# Patient Record
Sex: Female | Born: 1950 | Race: White | Hispanic: No | Marital: Married | Smoking: Never smoker
Health system: Southern US, Community
[De-identification: ages and names within clinical notes are randomized; demographics above are authoritative.]

## PROBLEM LIST (undated history)

## (undated) DIAGNOSIS — K219 Gastro-esophageal reflux disease without esophagitis: Secondary | ICD-10-CM

## (undated) DIAGNOSIS — F419 Anxiety disorder, unspecified: Secondary | ICD-10-CM

## (undated) DIAGNOSIS — E079 Disorder of thyroid, unspecified: Secondary | ICD-10-CM

## (undated) DIAGNOSIS — J3081 Allergic rhinitis due to animal (cat) (dog) hair and dander: Secondary | ICD-10-CM

## (undated) DIAGNOSIS — I10 Essential (primary) hypertension: Secondary | ICD-10-CM

## (undated) DIAGNOSIS — R7989 Other specified abnormal findings of blood chemistry: Secondary | ICD-10-CM

## (undated) DIAGNOSIS — M1990 Unspecified osteoarthritis, unspecified site: Secondary | ICD-10-CM

## (undated) HISTORY — PX: INDUCED ABORTION: SHX677

## (undated) HISTORY — DX: Anxiety disorder, unspecified: F41.9

## (undated) HISTORY — DX: Essential (primary) hypertension: I10

## (undated) HISTORY — PX: TONSILLECTOMY: SUR1361

## (undated) HISTORY — DX: Other specified abnormal findings of blood chemistry: R79.89

## (undated) HISTORY — PX: HAND SURGERY: SHX662

## (undated) HISTORY — DX: Gastro-esophageal reflux disease without esophagitis: K21.9

## (undated) HISTORY — DX: Allergic rhinitis due to animal (cat) (dog) hair and dander: J30.81

## (undated) HISTORY — PX: COLONOSCOPY: SHX174

---

## 2012-10-16 ENCOUNTER — Emergency Department (HOSPITAL_COMMUNITY): Payer: PRIVATE HEALTH INSURANCE

## 2012-10-16 ENCOUNTER — Encounter (HOSPITAL_COMMUNITY): Payer: Self-pay

## 2012-10-16 ENCOUNTER — Emergency Department (HOSPITAL_COMMUNITY)
Admission: EM | Admit: 2012-10-16 | Discharge: 2012-10-16 | Disposition: A | Discharge status: Home / Self Care | Payer: PRIVATE HEALTH INSURANCE | Attending: Emergency Medicine | Admitting: Emergency Medicine

## 2012-10-16 DIAGNOSIS — Z862 Personal history of diseases of the blood and blood-forming organs and certain disorders involving the immune mechanism: Secondary | ICD-10-CM | POA: Insufficient documentation

## 2012-10-16 DIAGNOSIS — Z8639 Personal history of other endocrine, nutritional and metabolic disease: Secondary | ICD-10-CM | POA: Insufficient documentation

## 2012-10-16 DIAGNOSIS — R209 Unspecified disturbances of skin sensation: Secondary | ICD-10-CM | POA: Insufficient documentation

## 2012-10-16 DIAGNOSIS — Y9389 Activity, other specified: Secondary | ICD-10-CM | POA: Insufficient documentation

## 2012-10-16 DIAGNOSIS — IMO0002 Reserved for concepts with insufficient information to code with codable children: Secondary | ICD-10-CM | POA: Insufficient documentation

## 2012-10-16 DIAGNOSIS — Y929 Unspecified place or not applicable: Secondary | ICD-10-CM | POA: Insufficient documentation

## 2012-10-16 DIAGNOSIS — S62619A Displaced fracture of proximal phalanx of unspecified finger, initial encounter for closed fracture: Secondary | ICD-10-CM

## 2012-10-16 DIAGNOSIS — Z8739 Personal history of other diseases of the musculoskeletal system and connective tissue: Secondary | ICD-10-CM | POA: Insufficient documentation

## 2012-10-16 DIAGNOSIS — W010XXA Fall on same level from slipping, tripping and stumbling without subsequent striking against object, initial encounter: Secondary | ICD-10-CM | POA: Insufficient documentation

## 2012-10-16 HISTORY — DX: Unspecified osteoarthritis, unspecified site: M19.90

## 2012-10-16 HISTORY — DX: Disorder of thyroid, unspecified: E07.9

## 2012-10-16 MED ORDER — HYDROCODONE-ACETAMINOPHEN 5-325 MG PO TABS
1.0000 | ORAL_TABLET | Freq: Once | ORAL | Status: AC
  Administered 2012-10-16: 1 via ORAL
  Filled 2012-10-16: qty 1

## 2012-10-16 MED ORDER — HYDROCODONE-ACETAMINOPHEN 5-325 MG PO TABS: ORAL_TABLET | ORAL | Status: DC

## 2012-10-16 NOTE — Progress Notes (Signed)
Orthopedic Tech Progress Note Patient Details:  Tauna Macfarlane August 08, 1951 [id number] Right ulna gutter splint applied. Tolerated well. Instructions given.  Ortho Devices Type of Ortho Device: Ulna gutter splint Ortho Device/Splint Location: Right Ortho Device/Splint Interventions: Application   Asia R Thompson 10/16/2012, 11:02 AM

## 2012-10-16 NOTE — Discharge Instructions (Signed)
Finger Fracture  Fractures of fingers are breaks in the bones of the fingers. There are many types of fractures. There are different ways of treating these fractures, all of which can be correct. Your caregiver will discuss the best way to treat your fracture.  TREATMENT   Finger fractures can be treated with:    Non-reduction - this means the bones are in place. The finger is splinted without changing the positions of the bone pieces. The splint is usually left on for about a week to ten days. This will depend on your fracture and what your caregiver thinks.   Closed reduction - the bones are put back into position without using surgery. The finger is then splinted.   ORIF (open reduction and internal fixation) - the fracture site is opened. Then the bone pieces are fixed into place with pins or some type of hardware. This is seldom required. It depends on the severity of the fracture.  Your caregiver will discuss the type of fracture you have and the treatment that will be best for that problem. If surgery is the treatment of choice, the following is information for you to know and also let your caregiver know about prior to surgery.  LET YOUR CAREGIVER KNOW ABOUT:   Allergies   Medications taken including herbs, eye drops, over the counter medications, and creams   Use of steroids (by mouth or creams)   Previous problems with anesthetics or Novocaine   Possibility of pregnancy, if this applies   History of blood clots (thrombophlebitis)   History of bleeding or blood problems   Previous surgery   Other health problems  AFTER THE PROCEDURE  After surgery, you will be taken to the recovery area where a nurse will check your progress. Once you're awake, stable, and taking fluids well, barring other problems you will be allowed to go home. Once home an ice pack applied to your operative site may help with discomfort and keep the swelling down.  HOME CARE INSTRUCTIONS     Follow your caregiver's instructions as to activities, exercises, physical therapy, and driving a car.   Use your finger and exercise as directed.   Only take over-the-counter or prescription medicines for pain, discomfort, or fever as directed by your caregiver. Do not take aspirin until your caregiver OK's it, as this can increase bleeding immediately following surgery.   Stop using ibuprofen if it upsets your stomach. Let your caregiver know about it.  SEEK MEDICAL CARE IF:   You have increased bleeding (more than a small spot) from the wound or from beneath your splint.   You develop redness, swelling, or increasing pain in the wound or from beneath your splint.   There is pus coming from the wound or from beneath your splint.   An unexplained oral temperature above 102 F (38.9 C) develops, or as your caregiver suggests.   There is a foul smell coming from the wound or dressing or from beneath your splint.  SEEK IMMEDIATE MEDICAL CARE IF:    You develop a rash.   You have difficulty breathing.   You have any allergic problems.  MAKE SURE YOU:    Understand these instructions.   Will watch your condition.   Will get help right away if you are not doing well or get worse.  Document Released: 12/13/2000 Document Revised: 11/23/2011 Document Reviewed: 04/19/2008  ExitCare Patient Information 2013 [employer].

## 2012-10-16 NOTE — ED Provider Notes (Addendum)
History     CSN: [id number]  Arrival date & time 10/16/12  0806   First MD Initiated Contact with Patient 10/16/12 [phone]      Chief Complaint  Patient presents with  . Hand Pain    (Consider location/radiation/quality/duration/timing/severity/associated sxs/prior treatment) HPI Comments: Patient is a 62 y/o female who presents to the ED with her husband complaining of right hand pain.  Patient states that she stumbled while getting out of the car and fell on her hand approximately 20 minutes ago.  She denies hitting her head and loss of consciousness.  Pain is described as 4/10, dull, achy pain that radiates to the elbow and to the fingertips.  Patient notes some associated numbness and tingling in her ring finger and pinky.  Movement aggravates pain.  No relieving factors have been identified.  Patient denies any prior injuries to her hand.  Patient is right handed and is an empty nester.        Patient is a 62 y.o. female presenting with hand pain. The history is provided by the patient and the spouse. No language interpreter was used.  Hand Pain    Past Medical History  Diagnosis Date  . Arthritis   . Thyroid disease     Past Surgical History  Procedure Date  . Tonsillectomy   . Induced abortion     No family history on file.  History  Substance Use Topics  . Smoking status: Never Smoker   . Smokeless tobacco: Not on file  . Alcohol Use: Yes     Comment: social    OB History    Grav Para Term Preterm Abortions TAB SAB Ect Mult Living                  Review of Systems  Musculoskeletal: Positive for arthralgias.  Neurological: Positive for numbness.  All other systems reviewed and are negative.    Allergies  Review of patient's allergies indicates no known allergies.  Home Medications   Current Outpatient Rx  Name  Route  Sig  Dispense  Refill  . HYDROCODONE-ACETAMINOPHEN 5-325 MG PO TABS      1-2 tablets po q 6 hours prn moderate to severe pain   20  tablet   0     BP 155/83  Pulse 73  Temp 97.7 F (36.5 C) (Oral)  Resp 20  SpO2 100%  Physical Exam  Constitutional: She is oriented to person, place, and time. She appears well-developed and well-nourished. No distress.  HENT:  Head: Normocephalic and atraumatic.  Eyes: Conjunctivae normal are normal.  Neck: Normal range of motion. Neck supple.  Cardiovascular: Normal rate, regular rhythm and normal heart sounds.  Exam reveals no gallop and no friction rub.   No murmur heard. Pulses:      Radial pulses are 2+ on the right side, and 2+ on the left side.  Pulmonary/Chest: Effort normal and breath sounds normal. No respiratory distress. She has no wheezes. She has no rales. She exhibits no tenderness.  Musculoskeletal:       Mild edema of the right hand, with no evidence of ecchymosis or erythema.  Tenderness to palpation of the 5th Metacarpal and proximal phalanx.  No snuff box tenderness noted.  Full active ROM of the right elbow and wrist.  Active ROM of the fingers was mildly limited due to pain.  5/5 opposition of the thumb.      Neurological: She is alert and oriented to person, place, and  time.       Sensation intact to touch in the median, ulnar, and radial nerve distribution in the right hand.    Skin: Skin is warm and dry. She is not diaphoretic.  Psychiatric: She has a normal mood and affect. Her behavior is normal.    ED Course  Procedures (including critical care time)   Provided initial definitive fracture care until seen by hand surgery with upper extremity ulnar gutter splint.     Labs Reviewed - No data to display Dg Hand Complete Right  10/16/2012  *RADIOLOGY REPORT*  Clinical Data: Pain post fall  RIGHT HAND - COMPLETE 3+ VIEW  Comparison: None.  Findings: Transverse fracture across the base of the proximal phalanx right little finger, with 1-2 mm distraction and mild dorsal angulation of the distal fracture fragment.  No fracture extension to the subchondral  cortex articular surface.  No other bony abnormalities are seen.  Normal mineralization.  No significant osseous degenerative change .  IMPRESSION:  Angulated transverse fracture, base proximal phalanx right little finger.   Original Report Authenticated By: D. Andria Rhein, MD      1. Proximal phalanx fracture of finger     I reviewed the plain films myself and reveals a mildly displaced, proximal transverse phalanx fracture of the fifth digit of her right hand.  MDM   Patient with finger fracture from mechanical fall of her right dominant upper extremity. No snuff box tenderness, full range of motion of wrist. Full range of motion of shoulder and elbow without discomfort. I discussed with Dr. Mina Marble who reviewed the x-ray recommends that she does need a surgical procedure in 7-10 days. Patient is given options to have surgery done either in home town in [place], [place] or back here. Plan is to place an ulnar splint, prescription for analgesics and return precautions.        Gavin Pound. Oletta Lamas, MD 10/16/12 4098  Gavin Pound. Oletta Lamas, MD 10/16/12 1191

## 2012-10-16 NOTE — ED Notes (Signed)
Ortho tech paged  

## 2012-10-16 NOTE — ED Notes (Signed)
Pt fell outside of [employer] and "broke" her fall with right hand.  Pt now c/o right hand pain.

## 2015-10-04 LAB — CBC AND DIFFERENTIAL
HCT: 43 (ref 36–46)
Hemoglobin: 14.3 (ref 12.0–16.0)
Platelets: 220 (ref 150–399)
WBC: 3.8

## 2015-10-04 LAB — BASIC METABOLIC PANEL
BUN: 13 (ref 4–21)
Creatinine: 0.9 (ref <=1.1)
GLUCOSE: 93
POTASSIUM: 5.1 (ref 3.4–5.3)
SODIUM: 136 — AB (ref 137–147)

## 2015-10-04 LAB — LIPID PANEL
Cholesterol: 239 — AB (ref 0–200)
HDL: 53 (ref 35–70)
LDL Cholesterol: 172
Triglycerides: 70 (ref 40–160)

## 2015-10-17 ENCOUNTER — Other Ambulatory Visit: Payer: Self-pay

## 2015-10-17 ENCOUNTER — Other Ambulatory Visit (HOSPITAL_COMMUNITY)
Admission: RE | Admit: 2015-10-17 | Discharge: 2015-10-17 | Disposition: A | Discharge status: Home / Self Care | Payer: BLUE CROSS/BLUE SHIELD | Source: Ambulatory Visit | Attending: Family Medicine | Admitting: Family Medicine

## 2015-10-17 DIAGNOSIS — Z01419 Encounter for gynecological examination (general) (routine) without abnormal findings: Secondary | ICD-10-CM | POA: Diagnosis present

## 2015-10-17 DIAGNOSIS — Z1231 Encounter for screening mammogram for malignant neoplasm of breast: Secondary | ICD-10-CM

## 2015-10-17 LAB — HM PAP SMEAR: HM Pap smear: NORMAL

## 2015-10-18 LAB — HM HEPATITIS C SCREENING LAB: HM Hepatitis Screen: NEGATIVE

## 2015-10-22 LAB — CYTOLOGY - PAP

## 2017-09-21 ENCOUNTER — Other Ambulatory Visit: Payer: Self-pay

## 2017-09-21 ENCOUNTER — Encounter: Payer: Self-pay | Admitting: Physician Assistant

## 2017-09-21 ENCOUNTER — Ambulatory Visit (INDEPENDENT_AMBULATORY_CARE_PROVIDER_SITE_OTHER): Payer: BLUE CROSS/BLUE SHIELD | Admitting: Physician Assistant

## 2017-09-21 DIAGNOSIS — K219 Gastro-esophageal reflux disease without esophagitis: Secondary | ICD-10-CM | POA: Diagnosis not present

## 2017-09-21 NOTE — Patient Instructions (Signed)
Start the Omeprazole daily as directed. Increase fluid intake. Continue natural supplements. I do not feel we need to start the Carafate presently. Limit diffuser use for now. Limit Turmeric.  Follow-up with me in 1 week for reassessment.   Food Choices for Gastroesophageal Reflux Disease, Adult When you have gastroesophageal reflux disease (GERD), the foods you eat and your eating habits are very important. Choosing the right foods can help ease your discomfort. What guidelines do I need to follow?  Choose fruits, vegetables, whole grains, and low-fat dairy products.  Choose low-fat meat, fish, and poultry.  Limit fats such as oils, salad dressings, butter, nuts, and avocado.  Keep a food diary. This helps you identify foods that cause symptoms.  Avoid foods that cause symptoms. These may be different for everyone.  Eat small meals often instead of 3 large meals a day.  Eat your meals slowly, in a place where you are relaxed.  Limit fried foods.  Cook foods using methods other than frying.  Avoid drinking alcohol.  Avoid drinking large amounts of liquids with your meals.  Avoid bending over or lying down until 2-3 hours after eating. What foods are not recommended? These are some foods and drinks that may make your symptoms worse: Vegetables Tomatoes. Tomato juice. Tomato and spaghetti sauce. Chili peppers. Onion and garlic. Horseradish. Fruits Oranges, grapefruit, and lemon (fruit and juice). Meats High-fat meats, fish, and poultry. This includes hot dogs, ribs, ham, sausage, salami, and bacon. Dairy Whole milk and chocolate milk. Sour cream. Cream. Butter. Ice cream. Cream cheese. Drinks Coffee and tea. Bubbly (carbonated) drinks or energy drinks. Condiments Hot sauce. Barbecue sauce. Sweets/Desserts Chocolate and cocoa. Donuts. Peppermint and spearmint. Fats and Oils High-fat foods. This includes [place] fries and potato chips. Other Vinegar. Strong spices.  This includes black pepper, white pepper, red pepper, cayenne, curry powder, cloves, ginger, and chili powder. The items listed above may not be a complete list of foods and drinks to avoid. Contact your dietitian for more information. This information is not intended to replace advice given to you by your health care provider. Make sure you discuss any questions you have with your health care provider. Document Released: 03/01/2012 Document Revised: 02/06/2016 Document Reviewed: 07/05/2013 Elsevier Interactive Patient Education  2017 [employer]

## 2017-09-21 NOTE — Progress Notes (Signed)
Patient presents to clinic today to establish care.  Acute Concerns: Patient with history of GERD, previously controlled with paleo diet and supplements given by naturopath. Over the past few weeks has noted increased GERD with globus sensation, intermittent LUQ pain and occasional nausea. Was seen at an Urgent Care 2 days ago and given an Rx for Carafate and Prilosec. Has not started medication. Is keeping a bland diet. Denies melena, hematochezia, tenesmus or change to bowel habits.    Past Medical History:  Diagnosis Date  . Arthritis   . GERD (gastroesophageal reflux disease)   . Thyroid disease     Past Surgical History:  Procedure Laterality Date  . HAND SURGERY Right   . INDUCED ABORTION    . TONSILLECTOMY      Current Outpatient Medications on File Prior to Visit  Medication Sig Dispense Refill  . calcium carbonate 1250 MG capsule Take 1,250 mg by mouth 2 (two) times daily with a meal.    . l-methylfolate-B6-B12 (METANX) 3-35-2 MG TABS tablet Take 1 tablet by mouth daily.    Marland Kitchen omega-3 fish oil (MAXEPA) 1000 MG CAPS capsule Take 1 capsule by mouth daily.    . vitamin E 1000 UNIT capsule Take 1,000 Units by mouth daily.    Marland Kitchen omeprazole (PRILOSEC) 20 MG capsule   0  . sucralfate (CARAFATE) 1 g tablet   0   No current facility-administered medications on file prior to visit.     No Known Allergies  Family History  Problem Relation Age of Onset  . Cancer Mother   . Cancer Father        Colon  . Diabetes Sister   . Heart disease Brother   . Emphysema Brother     Social History   Socioeconomic History  . Marital status: Married    Spouse name: Not on file  . Number of children: Not on file  . Years of education: Not on file  . Highest education level: Not on file  Social Needs  . Financial resource strain: Not on file  . Food insecurity - worry: Not on file  . Food insecurity - inability: Not on file  . Transportation needs - medical: Not on file  .  Transportation needs - non-medical: Not on file  Occupational History  . Not on file  Tobacco Use  . Smoking status: Never Smoker  . Smokeless tobacco: Never Used  Substance and Sexual Activity  . Alcohol use: No    Frequency: Never  . Drug use: No  . Sexual activity: Not on file  Other Topics Concern  . Not on file  Social History Narrative  . Not on file   Review of Systems  Eyes: Negative for blurred vision and double vision.  Respiratory: Positive for cough. Negative for sputum production.   Cardiovascular: Negative for chest pain and palpitations.  Gastrointestinal: Positive for abdominal pain, heartburn and nausea. Negative for blood in stool, constipation, diarrhea, melena and vomiting.  Genitourinary: Negative for dysuria, flank pain, frequency, hematuria and urgency.  Neurological: Negative for dizziness and loss of consciousness.   BP 128/82   Pulse 61   Temp 97.8 F (36.6 C) (Oral)   Resp 14   Ht 5\' 1"  (1.549 m)   Wt 170 lb (77.1 kg)   SpO2 98%   BMI 32.12 kg/m   Physical Exam  Constitutional: She is well-developed, well-nourished, and in no distress.  HENT:  Head: Normocephalic and atraumatic.  Eyes: Conjunctivae are normal.  Neck: Neck supple.  Cardiovascular: Normal rate, regular rhythm, normal heart sounds and intact distal pulses.  Pulmonary/Chest: Effort normal and breath sounds normal. No respiratory distress. She has no wheezes. She has no rales. She exhibits no tenderness.  Abdominal: Soft. Bowel sounds are normal. She exhibits no distension and no mass. There is tenderness in the left upper quadrant. There is no rebound and no guarding.  Vitals reviewed.  Assessment/Plan: 1. Gastroesophageal reflux disease without esophagitis Start Prilosec in addition to her natural digestive enzymes and supplements. GERD diet recommended. Increase fluid intake. Close follow-up scheduled. Will complete CPE at that time.    Piedad ClimesWilliam Cody Blaike Vickers, PA-C

## 2017-09-22 ENCOUNTER — Telehealth: Payer: Self-pay | Admitting: *Deleted

## 2017-09-22 NOTE — Telephone Encounter (Signed)
   Copied from CRM [id number]. Topic: General - Other >> Sep 22, 2017 10:40 AM Debroah LoopLander, Lumin L wrote: Reason for CRM: Patient saw Selena BattenCody yesterday as a new patient and has a question about Omeprazole that he prescribed. She wants to know if she can take prednisone w/ it? Please call back.

## 2017-09-22 NOTE — Telephone Encounter (Signed)
Spoke with patient and advised to hold off on starting the Prednisone for the bronchitis with the Omeprazole. She has follow up with PCP next week. Can discuss in further then

## 2017-09-28 ENCOUNTER — Ambulatory Visit (INDEPENDENT_AMBULATORY_CARE_PROVIDER_SITE_OTHER): Payer: BLUE CROSS/BLUE SHIELD | Admitting: Physician Assistant

## 2017-09-28 ENCOUNTER — Encounter: Payer: Self-pay | Admitting: Physician Assistant

## 2017-09-28 VITALS — BP 118/80 | HR 64 | Temp 97.8°F | Resp 14 | Ht 61.0 in | Wt 169.0 lb

## 2017-09-28 DIAGNOSIS — B9689 Other specified bacterial agents as the cause of diseases classified elsewhere: Secondary | ICD-10-CM | POA: Diagnosis not present

## 2017-09-28 DIAGNOSIS — J208 Acute bronchitis due to other specified organisms: Secondary | ICD-10-CM | POA: Diagnosis not present

## 2017-09-28 DIAGNOSIS — K219 Gastro-esophageal reflux disease without esophagitis: Secondary | ICD-10-CM | POA: Diagnosis not present

## 2017-09-28 MED ORDER — AZITHROMYCIN 250 MG PO TABS: ORAL_TABLET | ORAL | 0 refills | Status: DC

## 2017-09-28 MED ORDER — BENZONATATE 100 MG PO CAPS
100.0000 mg | ORAL_CAPSULE | Freq: Two times a day (BID) | ORAL | 0 refills | Status: DC | PRN
Start: 2017-09-28 — End: 2017-10-12

## 2017-09-28 NOTE — Progress Notes (Signed)
Patient presents to clinic today for follow-up of GERD. At last visit, she was started on Prilosec in addition to diet and digestive enzymes. Is taking as directed with improvement in symptoms. Still noting some occasional "spasming in her esophagus". Symptoms are still worse at night. Endorses following a strict diet. Denies new or worsening symptoms.  Patient also noted cough x 2 weeks that is now productive of sometimes green sputum. Denies chest pain, shortness of breath, ear pain or sinus pain. Denies fever, chills, aches. Symptoms are not improving and are gradually worsening.  Past Medical History:  Diagnosis Date  . Arthritis   . GERD (gastroesophageal reflux disease)   . Thyroid disease     Current Outpatient Medications on File Prior to Visit  Medication Sig Dispense Refill  . calcium carbonate 1250 MG capsule Take 1,250 mg by mouth 2 (two) times daily with a meal.    . l-methylfolate-B6-B12 (METANX) 3-35-2 MG TABS tablet Take 1 tablet by mouth daily.    Marland Kitchen. omega-3 fish oil (MAXEPA) 1000 MG CAPS capsule Take 1 capsule by mouth daily.    Marland Kitchen. omeprazole (PRILOSEC) 20 MG capsule Take 20 mg by mouth daily.   0  . vitamin E 1000 UNIT capsule Take 1,000 Units by mouth daily.    . sucralfate (CARAFATE) 1 g tablet   0   No current facility-administered medications on file prior to visit.     No Known Allergies  Family History  Problem Relation Age of Onset  . Cancer Mother   . Cancer Father        Colon  . Diabetes Sister   . Heart disease Brother   . Emphysema Brother     Social History   Socioeconomic History  . Marital status: Married    Spouse name: None  . Number of children: None  . Years of education: None  . Highest education level: None  Social Needs  . Financial resource strain: None  . Food insecurity - worry: None  . Food insecurity - inability: None  . Transportation needs - medical: None  . Transportation needs - non-medical: None  Occupational  History  . None  Tobacco Use  . Smoking status: Never Smoker  . Smokeless tobacco: Never Used  Substance and Sexual Activity  . Alcohol use: No    Frequency: Never  . Drug use: No  . Sexual activity: None  Other Topics Concern  . None  Social History Narrative  . None   Review of Systems - See HPI.  All other ROS are negative.  BP 118/80   Pulse 64   Temp 97.8 F (36.6 C) (Oral)   Resp 14   Ht 5\' 1"  (1.549 m)   Wt 169 lb (76.7 kg)   SpO2 98%   BMI 31.93 kg/m   Physical Exam  Constitutional: She is oriented to person, place, and time and well-developed, well-nourished, and in no distress.  HENT:  Head: Normocephalic and atraumatic.  Right Ear: Tympanic membrane normal.  Left Ear: Tympanic membrane normal.  Eyes: Conjunctivae are normal.  Neck: Neck supple.  Cardiovascular: Normal rate, regular rhythm, normal heart sounds and intact distal pulses.  Pulmonary/Chest: Effort normal. No respiratory distress. She has no wheezes. She has rales (clear with coughing) in the left lower field. She exhibits no tenderness.  Neurological: She is alert and oriented to person, place, and time.  Skin: Skin is warm and dry. No rash noted.  Vitals reviewed.  Assessment/Plan: 1. Acute  bacterial bronchitis Rx Azithromycin.  Increase fluids.  Rest.  Saline nasal spray.  Probiotic.  Mucinex as directed.  Humidifier in bedroom. Tessalon per orders.  Call or return to clinic if symptoms are not improving.  - azithromycin (ZITHROMAX) 250 MG tablet; Take 2 tablets on Day 1. Then take 1 tablet daily.  Dispense: 6 tablet; Refill: 0 - benzonatate (TESSALON) 100 MG capsule; Take 1 capsule (100 mg total) by mouth 2 (two) times daily as needed for cough.  Dispense: 20 capsule; Refill: 0  2. Gastroesophageal reflux disease without esophagitis Improving with PPI and dietary changes. URI exacerbating symptoms. Will increase PPI to BID x 3 days to help. Then decreasing back to QD dosing. Supportive  measures reviewed. Follow-up scheduled.    Piedad Climes, PA-C

## 2017-09-28 NOTE — Patient Instructions (Signed)
Over the next couple of days, increase the Omeprazole to twice daily. Then resume once daily dosing. Continue digestive enzymes and diet.  Take antibiotic (Azithromycin) as directed.  Increase fluids.  Get plenty of rest. Use Mucinex for congestion. Tessalon as directed for cough. Take a daily probiotic (I recommend Align or Culturelle, but even Activia Yogurt may be beneficial).  A humidifier placed in the bedroom may offer some relief for a dry, scratchy throat of nasal irritation.  Read information below on acute bronchitis. Please call or return to clinic if symptoms are not improving.  Follow-up in 1 week for reassessment.   Acute Bronchitis Bronchitis is when the airways that extend from the windpipe into the lungs get red, puffy, and painful (inflamed). Bronchitis often causes thick spit (mucus) to develop. This leads to a cough. A cough is the most common symptom of bronchitis. In acute bronchitis, the condition usually begins suddenly and goes away over time (usually in 2 weeks). Smoking, allergies, and asthma can make bronchitis worse. Repeated episodes of bronchitis may cause more lung problems.  HOME CARE  Rest.  Drink enough fluids to keep your pee (urine) clear or pale yellow (unless you need to limit fluids as told by your doctor).  Only take over-the-counter or prescription medicines as told by your doctor.  Avoid smoking and secondhand smoke. These can make bronchitis worse. If you are a smoker, think about using nicotine gum or skin patches. Quitting smoking will help your lungs heal faster.  Reduce the chance of getting bronchitis again by:  Washing your hands often.  Avoiding people with cold symptoms.  Trying not to touch your hands to your mouth, nose, or eyes.  Follow up with your doctor as told.  GET HELP IF: Your symptoms do not improve after 1 week of treatment. Symptoms include:  Cough.  Fever.  Coughing up thick spit.  Body aches.  Chest  congestion.  Chills.  Shortness of breath.  Sore throat.  GET HELP RIGHT AWAY IF:   You have an increased fever.  You have chills.  You have severe shortness of breath.  You have bloody thick spit (sputum).  You throw up (vomit) often.  You lose too much body fluid (dehydration).  You have a severe headache.  You faint.  MAKE SURE YOU:   Understand these instructions.  Will watch your condition.  Will get help right away if you are not doing well or get worse. Document Released: 02/17/2008 Document Revised: 05/03/2013 Document Reviewed: 02/21/2013 [facility] Patient Information 2015 [employer], [place]. This information is not intended to replace advice given to you by your health care provider. Make sure you discuss any questions you have with your health care provider.

## 2017-10-01 ENCOUNTER — Encounter: Payer: Self-pay | Admitting: Emergency Medicine

## 2017-10-12 ENCOUNTER — Other Ambulatory Visit: Payer: Self-pay

## 2017-10-12 ENCOUNTER — Encounter: Payer: Self-pay | Admitting: Physician Assistant

## 2017-10-12 ENCOUNTER — Ambulatory Visit (INDEPENDENT_AMBULATORY_CARE_PROVIDER_SITE_OTHER): Payer: BLUE CROSS/BLUE SHIELD | Admitting: Physician Assistant

## 2017-10-12 VITALS — BP 120/70 | HR 61 | Temp 97.5°F | Resp 14 | Ht 61.0 in | Wt 167.0 lb

## 2017-10-12 DIAGNOSIS — K219 Gastro-esophageal reflux disease without esophagitis: Secondary | ICD-10-CM

## 2017-10-12 DIAGNOSIS — J3081 Allergic rhinitis due to animal (cat) (dog) hair and dander: Secondary | ICD-10-CM | POA: Insufficient documentation

## 2017-10-12 HISTORY — DX: Allergic rhinitis due to animal (cat) (dog) hair and dander: J30.81

## 2017-10-12 MED ORDER — PANTOPRAZOLE SODIUM 40 MG PO TBEC: 40.0000 mg | DELAYED_RELEASE_TABLET | Freq: Every day | ORAL | 3 refills | Status: DC

## 2017-10-12 NOTE — Assessment & Plan Note (Signed)
Keep animal out of bedroom. Wash linens. Air purifier in bedroom recommended. Start saline nasal rinses. May need to start OTC antihistamine.

## 2017-10-12 NOTE — Patient Instructions (Signed)
Please keep well-hydrated and get plenty of rest. Start the Pantoprazole once daily instead of the Omeprazole.  Keep a GERD diet (read info below).  Start saline nasal rinses for nasal drainage.  Consider starting OTC Xyzal. Recommend keeping the dog off the bed and out of bedroom.  You will be contacted by Gastroenterology for further assessment.  If you notice any new or worsening symptoms, please call or come see me.    Food Choices for Gastroesophageal Reflux Disease, Adult When you have gastroesophageal reflux disease (GERD), the foods you eat and your eating habits are very important. Choosing the right foods can help ease your discomfort. What guidelines do I need to follow?  Choose fruits, vegetables, whole grains, and low-fat dairy products.  Choose low-fat meat, fish, and poultry.  Limit fats such as oils, salad dressings, butter, nuts, and avocado.  Keep a food diary. This helps you identify foods that cause symptoms.  Avoid foods that cause symptoms. These may be different for everyone.  Eat small meals often instead of 3 large meals a day.  Eat your meals slowly, in a place where you are relaxed.  Limit fried foods.  Cook foods using methods other than frying.  Avoid drinking alcohol.  Avoid drinking large amounts of liquids with your meals.  Avoid bending over or lying down until 2-3 hours after eating. What foods are not recommended? These are some foods and drinks that may make your symptoms worse: Vegetables Tomatoes. Tomato juice. Tomato and spaghetti sauce. Chili peppers. Onion and garlic. Horseradish. Fruits Oranges, grapefruit, and lemon (fruit and juice). Meats High-fat meats, fish, and poultry. This includes hot dogs, ribs, ham, sausage, salami, and bacon. Dairy Whole milk and chocolate milk. Sour cream. Cream. Butter. Ice cream. Cream cheese. Drinks Coffee and tea. Bubbly (carbonated) drinks or energy drinks. Condiments Hot sauce. Barbecue  sauce. Sweets/Desserts Chocolate and cocoa. Donuts. Peppermint and spearmint. Fats and Oils High-fat foods. This includes JamaicaFrench fries and potato chips. Other Vinegar. Strong spices. This includes black pepper, white pepper, red pepper, cayenne, curry powder, cloves, ginger, and chili powder. The items listed above may not be a complete list of foods and drinks to avoid. Contact your dietitian for more information. This information is not intended to replace advice given to you by your health care provider. Make sure you discuss any questions you have with your health care provider. Document Released: 03/01/2012 Document Revised: 02/06/2016 Document Reviewed: 07/05/2013 Elsevier Interactive Patient Education  2017 [employer]

## 2017-10-12 NOTE — Progress Notes (Signed)
Patient presents to clinic today for follow-up of GERD and acute bronchitis. At last visit, patient was started on Azithromycin in addition to the Prilosec already started for GERD. She had been doing better with reflux but the infection and drainage was causing an acute exacerbation in GERD symptoms. Patient endorses doing much better. Endorses occasional dry cough, worse at night. Notes some nasal drainage and runny nose, worse at night. Does have a dog that sleeps in the bed and she things she is allergic.    Past Medical History:  Diagnosis Date  . Arthritis   . GERD (gastroesophageal reflux disease)   . Thyroid disease     Current Outpatient Medications on File Prior to Visit  Medication Sig Dispense Refill  . calcium carbonate 1250 MG capsule Take 1,250 mg by mouth 2 (two) times daily with a meal.    . l-methylfolate-B6-B12 (METANX) 3-35-2 MG TABS tablet Take 1 tablet by mouth daily.    Marland Kitchen. omega-3 fish oil (MAXEPA) 1000 MG CAPS capsule Take 1 capsule by mouth daily.    . vitamin E 1000 UNIT capsule Take 1,000 Units by mouth daily.     No current facility-administered medications on file prior to visit.     No Known Allergies  Family History  Problem Relation Age of Onset  . Cancer Mother   . Cancer Father        Colon  . Diabetes Sister   . Heart disease Brother   . Emphysema Brother     Social History   Socioeconomic History  . Marital status: Married    Spouse name: None  . Number of children: None  . Years of education: None  . Highest education level: None  Social Needs  . Financial resource strain: None  . Food insecurity - worry: None  . Food insecurity - inability: None  . Transportation needs - medical: None  . Transportation needs - non-medical: None  Occupational History  . None  Tobacco Use  . Smoking status: Never Smoker  . Smokeless tobacco: Never Used  Substance and Sexual Activity  . Alcohol use: No    Frequency: Never  . Drug use: No  .  Sexual activity: None  Other Topics Concern  . None  Social History Narrative  . None   Review of Systems - See HPI.  All other ROS are negative.  BP 120/70   Pulse 61   Temp (!) 97.5 F (36.4 C) (Oral)   Resp 14   Ht 5\' 1"  (1.549 m)   Wt 167 lb (75.8 kg)   SpO2 98%   BMI 31.55 kg/m   Physical Exam  Constitutional: She is oriented to person, place, and time and well-developed, well-nourished, and in no distress.  HENT:  Head: Normocephalic and atraumatic.  Eyes: Conjunctivae are normal. Pupils are equal, round, and reactive to light.  Neck: Neck supple.  Cardiovascular: Normal rate, regular rhythm, normal heart sounds and intact distal pulses.  Pulmonary/Chest: Effort normal and breath sounds normal. No respiratory distress. She has no wheezes. She has no rales. She exhibits no tenderness.  Lymphadenopathy:    She has no cervical adenopathy.  Neurological: She is alert and oriented to person, place, and time.  Skin: Skin is warm and dry. No rash noted.  Psychiatric: Affect normal.  Vitals reviewed.  Assessment/Plan: Allergic rhinitis due to animal hair and dander Keep animal out of bedroom. Wash linens. Air purifier in bedroom recommended. Start saline nasal rinses. May need to  start OTC antihistamine.   GERD (gastroesophageal reflux disease) Stop Omeprazole. Start Protonix. Continue supportive measures. Referral to GI placed for significant GERD. Continue dietary recommendations.    Piedad Climes, PA-C

## 2017-10-12 NOTE — Assessment & Plan Note (Signed)
Stop Omeprazole. Start Protonix. Continue supportive measures. Referral to GI placed for significant GERD. Continue dietary recommendations.

## 2017-10-13 ENCOUNTER — Encounter: Payer: Self-pay | Admitting: Physician Assistant

## 2017-11-01 ENCOUNTER — Encounter: Payer: Self-pay | Admitting: Physician Assistant

## 2017-11-01 ENCOUNTER — Ambulatory Visit (INDEPENDENT_AMBULATORY_CARE_PROVIDER_SITE_OTHER): Payer: BLUE CROSS/BLUE SHIELD | Admitting: Physician Assistant

## 2017-11-01 VITALS — BP 116/68 | HR 64 | Ht 61.0 in | Wt 163.2 lb

## 2017-11-01 DIAGNOSIS — Z800 Family history of malignant neoplasm of digestive organs: Secondary | ICD-10-CM | POA: Diagnosis not present

## 2017-11-01 DIAGNOSIS — R079 Chest pain, unspecified: Secondary | ICD-10-CM

## 2017-11-01 DIAGNOSIS — R1013 Epigastric pain: Secondary | ICD-10-CM

## 2017-11-01 MED ORDER — RANITIDINE HCL 150 MG PO TABS: ORAL_TABLET | ORAL | 2 refills | Status: DC

## 2017-11-01 NOTE — Patient Instructions (Signed)
We sent a prescription to [facility] [address]  1. Zantac 150 mg  Gradually advance your diet. Call us in 4-6 weeks with an update. Ask for Amy's nurse, we will decide then if you need an Endoscopy and colonoscopy or just a colonoscopy.   If you are age 67 or older, your body mass index should be between 23-30. Your Body mass index is 30.85 kg/m. If this is out of the aforementioned range listed, please consider follow up with your Primary Care Provider.

## 2017-11-01 NOTE — Progress Notes (Addendum)
Subjective:    Patient ID: Haley Ritter, female    DOB: 19-Mar-1951, 67 y.o.   MRN: [id number]  HPI Haley Ritter is a pleasant 67 year old white female, new to GI today referred by Piedad Climes PA-C for evaluation of epigastric pain and subxiphoid pain. Patient relates having prior screening colonoscopy done 10 or more years ago in [place]. She believes this was negative. She has not had prior EGD Patient had been being seen at [facility] since summer of 2018 and had related symptoms that she thought were consistent with reflux. She was placed on their reflux protocol which included digestive enzymes, Gastro ease with zinc, licorice and an HCl supplement. She felt like this was helping her and symptoms had improved until around Christmas time when she got off of her usual diet and schedule of medications etc. She then became ill with what she describes as a very bad episode of bronchitis which was viral. She says she was sick for several weeks with this. This prior to getting sick with the bronchitis she started have some epigastric discomfort and then since the bronchitis has had a lot of pain in the epigastrium and upper abdomen. She describes it as a soreness her goal 8. She has also had burning in her lower chest. At times with the bronchitis she had discomfort in her chest with deep breathing but that has resolved. She also mentions that she had been diagnosed with costochondritis a couple of years ago. She also has another pain in her left upper quadrant which she says has come and gone over the past 10 years. Bowel movements have been normal for her. She has been trying to do exercises to help "bring down" a hiatal hernia. She has  also changed her diet drastically from a Paleo  diet to mostly vegan diet and has been juicing and drinking shakes primarily rather than eating regular food. She feels this is helping her stomach a little bit. Weight is down about 8 pounds. No excessive  belching or burping no dysphagia or odynophagia. She did take a short course of omeprazole for about a week but says it didn't help and stopped it. Review of her chart shows that she did H. pylori breath testing in 2017 that was negative. Family history is positive for colon cancer in her father diagnosed in his early 34s.  Review of Systems Pertinent positive and negative review of systems were noted in the above HPI section.  All other review of systems was otherwise negative.  Outpatient Encounter Medications as of 11/01/2017  Medication Sig  . b complex vitamins capsule Take 1 capsule by mouth 2 (two) times daily.  Marland Kitchen omega-3 fish oil (MAXEPA) 1000 MG CAPS capsule Take 1 capsule by mouth daily.  Marland Kitchen OVER THE COUNTER MEDICATION Digestive Ezymes - 9 daily  . OVER THE COUNTER MEDICATION HCI with pepcid   6 daily  . OVER THE COUNTER MEDICATION Gastro Ease with zinc  3 daily  . OVER THE COUNTER MEDICATION Licorice 3 with meals  . Vitamin D, Ergocalciferol, (DRISDOL) 50000 units CAPS capsule Take 50,000 Units by mouth daily.  . vitamin E 1000 UNIT capsule Take 1,000 Units by mouth daily.  . ranitidine (ZANTAC) 150 MG tablet Take 1 tab by mouth twice daily.  . [DISCONTINUED] calcium carbonate 1250 MG capsule Take 1,250 mg by mouth 2 (two) times daily with a meal.  . [DISCONTINUED] l-methylfolate-B6-B12 (METANX) 3-35-2 MG TABS tablet Take 1 tablet by mouth  daily.  . [DISCONTINUED] pantoprazole (PROTONIX) 40 MG tablet Take 1 tablet (40 mg total) by mouth daily.   No facility-administered encounter medications on file as of 11/01/2017.    No Known Allergies Patient Active Problem List   Diagnosis Date Noted  . Allergic rhinitis due to animal hair and dander 10/12/2017  . GERD (gastroesophageal reflux disease) 09/21/2017   Social History   Socioeconomic History  . Marital status: Married    Spouse name: Not on file  . Number of children: 4  . Years of education: Not on file  . Highest  education level: Not on file  Social Needs  . Financial resource strain: Not on file  . Food insecurity - worry: Not on file  . Food insecurity - inability: Not on file  . Transportation needs - medical: Not on file  . Transportation needs - non-medical: Not on file  Occupational History  . Occupation: retired  Tobacco Use  . Smoking status: Former Games developer  . Smokeless tobacco: Never Used  . Tobacco comment: early 20's  Substance and Sexual Activity  . Alcohol use: No    Frequency: Never  . Drug use: No  . Sexual activity: Not on file  Other Topics Concern  . Not on file  Social History Narrative  . Not on file    Haley Ritter's family history includes Colon cancer in her father; Diabetes in her sister; Emphysema in her brother; Heart disease in her brother; Lung cancer in her mother.      Objective:    Vitals:   11/01/17 1418  BP: 116/68  Pulse: 64    Physical Exam; well-developed older white female in no acute distress, pleasant accompanied by her husband blood pressure 116/68 pulse 64, height 5 foot 1, weight 163, BMI of 30.8. HEENT; nontraumatic normocephalic EOMI PERRLA sclera anicteric, Cardiovascular; regular rate and rhythm with S1-S2 no murmur or gallop, Pulmonary; clear bilaterally Abdomen; soft, mild tenderness in the epigastrium there is no guarding or rebound no palpable mass or hepatosplenomegaly, no costal margin tenderness, Rectal; exam not done, Extremities; no clubbing cyanosis or edema skin warm and dry, Neuropsych; mood and affect appropriate       Assessment & Plan:   #[age] year old white female with probable underlying GERD, now with epigastric and subxiphoid discomfort over the past month exacerbated by a significant viral upper respiratory infection with bronchitis. Is suspect symptoms are secondary to GERD, possible mild esophagitis and/or gastritis. Patient had previously been tested for H. pylori and was negative. #2 colon cancer surveillance-last  colonoscopy 10+ years ago in [place], overdue for follow-up #3 positive family history of colon cancer in patient's father  Plan; patient prefers to avoid PPIs if possible, we will start ranitidine on 150 mg by mouth twice daily and plan to treat for 2-3 months. Antireflux diet-patient is encouraged to continue her current primarily vegetarian diet but gradually advanced most solid foods. We discussed EGD, which she is agreeable to if symptoms persist, but would like to hold off for now. We also discussed indication for colonoscopy which she is agreeable to. She will be established with Dr. Rhea Belton.. We agreed to treat her for her upper GI symptoms for about a month, and she is to call back with a progress report. If symptoms are persisting she will need EGD and colonoscopy scheduled with Dr. Rhea Belton, her GI symptoms resolve will need to be scheduled for colonoscopy with Dr. Rhea Belton when she calls back. She was also encouraged to  call in the interim at any point if symptoms worsen.  Haley Ritter S Tristine Langi PA-C 11/01/2017   Cc: Waldon MerlMartin, William C, PA-C  Addendum: Reviewed and agree with initial management including plans to repeat screening colonoscopy which is overdue in light of her father's history of colon cancer. ' Pyrtle, Carie CaddyJay M, MD

## 2018-01-07 ENCOUNTER — Telehealth: Payer: Self-pay | Admitting: Physician Assistant

## 2018-01-07 NOTE — Telephone Encounter (Signed)
Patient has made diet modification. She took Zantac for "one bottle." Her GERD symptoms have returned. She would like to go forward with EGD and get the colonoscopy done too. Are we okay to schedule her?

## 2018-01-07 NOTE — Telephone Encounter (Signed)
Pt wants to know if she should have an endocolon. Pls call her.

## 2018-01-10 NOTE — Telephone Encounter (Signed)
Spoke with the patient and she agrees to this plan. Preop 01/24/18. Procedure 02/02/18

## 2018-01-10 NOTE — Telephone Encounter (Signed)
I would advise she go back on Zantac 150 mg po BID - can send as RX , and the get her scheduled for EGD and COLON . Thanks

## 2018-01-24 ENCOUNTER — Ambulatory Visit (AMBULATORY_SURGERY_CENTER): Payer: Self-pay | Admitting: *Deleted

## 2018-01-24 ENCOUNTER — Other Ambulatory Visit: Payer: Self-pay

## 2018-01-24 VITALS — Ht 63.0 in | Wt 166.0 lb

## 2018-01-24 DIAGNOSIS — Z800 Family history of malignant neoplasm of digestive organs: Secondary | ICD-10-CM

## 2018-01-24 DIAGNOSIS — R1013 Epigastric pain: Secondary | ICD-10-CM

## 2018-01-24 MED ORDER — NA SULFATE-K SULFATE-MG SULF 17.5-3.13-1.6 GM/177ML PO SOLN: ORAL | 0 refills | Status: DC

## 2018-01-24 NOTE — Progress Notes (Signed)
Husband present in [place]. Patient denies any allergies to eggs or soy. Patient denies any problems with anesthesia/sedation. Patient denies any oxygen use at home. Patient denies taking any diet/weight loss medications or blood thinners. EMMI education assisgned to patient on colonoscopy and EGD, this was explained and instructions given to patient. Suprep coupon given to pt.

## 2018-02-02 ENCOUNTER — Encounter: Payer: BLUE CROSS/BLUE SHIELD | Admitting: Internal Medicine

## 2018-02-08 ENCOUNTER — Telehealth: Payer: Self-pay | Admitting: Internal Medicine

## 2018-02-08 NOTE — Telephone Encounter (Signed)
Pt is scheduled for an endocolon tomorrow at 2:30 pm with Dr. Rhea Belton. She forgot to follow diet 5 days before procedure so ate normally. She attended a graduation party, baby shower and memorial day celebrations. She wants to know if she can still have procedure tomorrow. Pls call her.

## 2018-02-08 NOTE — Telephone Encounter (Signed)
Discussed with pt that she should be fine as long as she follows her prep instructions as instructed. Pt verbalized understanding.

## 2018-02-09 ENCOUNTER — Encounter: Payer: BLUE CROSS/BLUE SHIELD | Admitting: Internal Medicine

## 2018-02-09 ENCOUNTER — Telehealth: Payer: Self-pay | Admitting: Internal Medicine

## 2018-02-09 ENCOUNTER — Other Ambulatory Visit: Payer: Self-pay

## 2018-02-09 DIAGNOSIS — R0789 Other chest pain: Secondary | ICD-10-CM

## 2018-02-09 NOTE — Telephone Encounter (Signed)
I spoke to Haley Ritter by phone She developed a severe headache last night after taking her first half of colonoscopy preparation. Headache has persisted. She is also having chest pressure located between her breasts to the left radiating to her back.  This has been intermittent. No real improvement with Zantac though she has not been using Zantac or other acid suppression recently. Initially she was seen by Mike Gip in February with similar symptom and Endo/colon was arranged and then rescheduled until today. She has had this previously evaluated by cardiology but greater than 3 years ago when she was living in [place] Last colonoscopy greater than 10 years ago; she does have a family history of colon cancer in her father  I do not think she can proceed with procedure today due to severe headache.  I also recommend that she be evaluated by cardiology for her chest pressure before we proceed with any endoscopic evaluation.  She is in agreement.  Please refer her for cardiology evaluation of chest pressure  Once she has completed cardiology evaluation I recommend that we proceed with consideration of endoscopy and colonoscopy.  I asked that she notify us once her cardiology evaluation is complete and she voiced understanding.  She is aware of my recommendation for endoscopic evaluation if no cardiology evaluation found but also for high risk screening colonoscopy.  Procedure canceled for today, no charge

## 2018-02-09 NOTE — Telephone Encounter (Signed)
Pt has colon scheduled for this afternoon. She needs to start prep at 8:30am but it is not feeling well, she has headache and feels a lot of pressure in her chest and her BP is higher than normal. She wants to make sure that it is ok to continue with second part of prep.

## 2018-02-09 NOTE — Telephone Encounter (Signed)
Referral entered in epic for cardiology-chest pain, cardiac clearance for endoscopy.

## 2018-02-09 NOTE — Telephone Encounter (Signed)
Called patient back, and she stated that she had "an excrutciating headache last night which she rated at an 8 out of 10. Stated that she had "chest pressure" without chest pain.  She stated that it was the reason for her Endoscopy for today.  Stated that she was very cold last night and she had to put on socks and wrap in a blanket.   I explained that the coldness was probably from the prep.  Her bp is 132/82 at present.  She stated that her headache was about "a four" at this time.  She has been drinking water, and she denies being dehydrated.  She states no chest pain at this time.  No nausea or vomiting.  states at "times the pain radiates to her back."  Due to take her prep at 0830. Spoke with Josh Monday, CRNA regarding her symptoms.   He will speak to Dr. Rhea Belton before he starts his cases for today.

## 2018-02-14 ENCOUNTER — Other Ambulatory Visit: Payer: Self-pay

## 2018-02-14 ENCOUNTER — Ambulatory Visit (INDEPENDENT_AMBULATORY_CARE_PROVIDER_SITE_OTHER): Payer: BLUE CROSS/BLUE SHIELD | Admitting: Physician Assistant

## 2018-02-14 ENCOUNTER — Encounter: Payer: Self-pay | Admitting: Physician Assistant

## 2018-02-14 VITALS — BP 110/70 | HR 66 | Temp 98.2°F | Resp 16 | Ht 63.0 in | Wt 166.0 lb

## 2018-02-14 DIAGNOSIS — K219 Gastro-esophageal reflux disease without esophagitis: Secondary | ICD-10-CM

## 2018-02-14 DIAGNOSIS — Z1211 Encounter for screening for malignant neoplasm of colon: Secondary | ICD-10-CM | POA: Diagnosis not present

## 2018-02-14 DIAGNOSIS — J3081 Allergic rhinitis due to animal (cat) (dog) hair and dander: Secondary | ICD-10-CM

## 2018-02-14 NOTE — Progress Notes (Signed)
Patient presents to clinic today to discuss continued epigastric discomfort, globus sensation and abdominal cramping. Patient with history of IBS and GERD, previously evaluated by this office. Was prescribed medication for symptoms and referred to GI. Has not continued medications as directed as she prefers to try natural remedies, such as reflexology, essential oils and other recommendations from her integrative specialist. Was scheduled for EGD and colonoscopy but noted chest discomfort during prep. As such, procedure was postponed until she has cardiac clearance.  Denies any new or worsening symptoms.   Past Medical History:  Diagnosis Date  . Anxiety   . Arthritis   . GERD (gastroesophageal reflux disease)   . Hypertension   . Thyroid disease     Current Outpatient Medications on File Prior to Visit  Medication Sig Dispense Refill  . ALOE VERA CONCENTRATE PO Take 2 Doses by mouth daily.    . Cholecalciferol (VITAMIN D3) 5000 units CAPS Take 1 capsule by mouth daily.    Marland Kitchen. co-enzyme Q-10 30 MG capsule Take 30 mg by mouth 3 (three) times daily.    . COCONUT OIL PO Take 1 capsule by mouth daily.    Marland Kitchen. omega-3 fish oil (MAXEPA) 1000 MG CAPS capsule Take 1 capsule by mouth daily.    Marland Kitchen. OVER THE COUNTER MEDICATION Digestive Ezymes - 9 daily    . Turmeric 400 MG CAPS Take 1 capsule by mouth daily.     No current facility-administered medications on file prior to visit.     No Known Allergies  Family History  Problem Relation Age of Onset  . Lung cancer Mother   . Colon cancer Father [id number]  . Diabetes Sister   . Heart disease Brother   . Emphysema Brother   . Esophageal cancer Neg Hx   . Stomach cancer Neg Hx     Social History   Socioeconomic History  . Marital status: Married    Spouse name: Not on file  . Number of children: 4  . Years of education: Not on file  . Highest education level: Not on file  Occupational History  . Occupation: retired  Engineer, productionocial Needs  . Financial  resource strain: Not on file  . Food insecurity:    Worry: Not on file    Inability: Not on file  . Transportation needs:    Medical: Not on file    Non-medical: Not on file  Tobacco Use  . Smoking status: Former Games developermoker  . Smokeless tobacco: Never Used  . Tobacco comment: early 20's  Substance and Sexual Activity  . Alcohol use: No    Frequency: Never  . Drug use: No  . Sexual activity: Yes  Lifestyle  . Physical activity:    Days per week: Not on file    Minutes per session: Not on file  . Stress: Not on file  Relationships  . Social connections:    Talks on phone: Not on file    Gets together: Not on file    Attends religious service: Not on file    Active member of club or organization: Not on file    Attends meetings of clubs or organizations: Not on file    Relationship status: Not on file  Other Topics Concern  . Not on file  Social History Narrative  . Not on file   Review of Systems - See HPI.  All other ROS are negative.  BP 110/70   Pulse 66   Temp 98.2 F (36.8 C) (Oral)  Resp 16   Ht 5\' 3"  (1.6 m)   Wt 166 lb (75.3 kg)   SpO2 98%   BMI 29.41 kg/m   Physical Exam  Constitutional: She is oriented to person, place, and time. She appears well-developed and well-nourished.  HENT:  Head: Normocephalic and atraumatic.  Eyes: Conjunctivae are normal.  Neck: Neck supple. No thyromegaly present.  Cardiovascular: Normal rate, regular rhythm and normal heart sounds.  Pulmonary/Chest: Effort normal and breath sounds normal. She exhibits no tenderness.  Abdominal: Bowel sounds are normal. She exhibits no distension and no mass. There is tenderness (chronic generalized). There is no guarding.  Lymphadenopathy:    She has no cervical adenopathy.  Neurological: She is alert and oriented to person, place, and time.  Psychiatric: She has a normal mood and affect.  Vitals reviewed.  Assessment/Plan: 1. Colon cancer screening Wishes to forego colonoscopy.  Discussed cologuard as an option for screening but giving her other GI issues, GI wants to proceed with colonoscopy. She will give further thought.   2. Allergic rhinitis due to animal hair and dander Start saline nasal rinses and a daily claritin. Declines Rx medication.  3. Gastroesophageal reflux disease without esophagitis Is not taking recommended medications as she wants to focus on natural remedies recommended by Integrative specialist. Encouraged to restart her PPI given at last visit as natural remedies are not helping. Proceed with EGD after cardiac clearance so we can get to the bottom of her issues.   Piedad Climes, PA-C

## 2018-02-14 NOTE — Patient Instructions (Signed)
Please keep well-hydrated and keep a well-balanced diet.  Ask your Haley Ritter about stretches to help with decompression of the spine.    Please start a saline nasal rinse to help flush out nasal passages at night. I would recommend trying a claritin over the counter to help with allergic inflammation while we are getting a further assessment of things.  I will send a message to Dr. Rhea BeltonPyrtle regarding your request to proceed with endoscopy without colonoscopy for now. We will need the clearance from cardiology so that we can get this taken care of.   Check with your insurance on coverage for the Cologuard test.

## 2018-02-17 NOTE — Telephone Encounter (Signed)
Pt scheduled to see Dr. Antoine PocheHochrein 03/11/18@8am , pt to arrive there at 7:45am. [address]. Left message for pt to call back.

## 2018-02-18 ENCOUNTER — Other Ambulatory Visit: Payer: Self-pay

## 2018-02-18 ENCOUNTER — Ambulatory Visit (INDEPENDENT_AMBULATORY_CARE_PROVIDER_SITE_OTHER): Payer: BLUE CROSS/BLUE SHIELD | Admitting: Physician Assistant

## 2018-02-18 ENCOUNTER — Encounter: Payer: Self-pay | Admitting: Physician Assistant

## 2018-02-18 VITALS — BP 112/78 | HR 55 | Temp 97.5°F | Resp 14 | Ht 63.0 in | Wt 165.0 lb

## 2018-02-18 DIAGNOSIS — Z1231 Encounter for screening mammogram for malignant neoplasm of breast: Secondary | ICD-10-CM

## 2018-02-18 DIAGNOSIS — R7989 Other specified abnormal findings of blood chemistry: Secondary | ICD-10-CM

## 2018-02-18 DIAGNOSIS — Z0000 Encounter for general adult medical examination without abnormal findings: Secondary | ICD-10-CM | POA: Insufficient documentation

## 2018-02-18 DIAGNOSIS — Z780 Asymptomatic menopausal state: Secondary | ICD-10-CM

## 2018-02-18 DIAGNOSIS — Z124 Encounter for screening for malignant neoplasm of cervix: Secondary | ICD-10-CM | POA: Diagnosis not present

## 2018-02-18 DIAGNOSIS — Z1239 Encounter for other screening for malignant neoplasm of breast: Secondary | ICD-10-CM | POA: Insufficient documentation

## 2018-02-18 HISTORY — DX: Other specified abnormal findings of blood chemistry: R79.89

## 2018-02-18 LAB — TSH: TSH: 3.5 u[IU]/mL (ref 0.35–4.50)

## 2018-02-18 LAB — T4, FREE: Free T4: 0.86 ng/dL (ref 0.60–1.60)

## 2018-02-18 NOTE — Assessment & Plan Note (Signed)
Mildly elevated at 4.9 on labs from integrative specialist. Repeat TSH and Free t4 today.

## 2018-02-18 NOTE — Assessment & Plan Note (Addendum)
Due. Denies concerns. Is being set up with GYN.

## 2018-02-18 NOTE — Assessment & Plan Note (Signed)
Declines exam today. Wishes to she GYN. Will place order for screening mammogram.

## 2018-02-18 NOTE — Patient Instructions (Signed)
Please go to the lab for blood work.  Our office will call you with your results unless you have chosen to receive results via MyChart. If your blood work is normal we will follow-up each year for physicals and as scheduled for chronic medical problems. If anything is abnormal we will treat accordingly and get you in for a follow-up.  Please start a red yeast rice supplement daily. You can get this OTC.  Look into seeing Dr. Alinda Money with [facility] and Dr. Oswaldo Conroy with [facility]. These are great practices!   Preventive Care 20 Years and Older, Female Preventive care refers to lifestyle choices and visits with your health care provider that can promote health and wellness. What does preventive care include?  A yearly physical exam. This is also called an annual well check.  Dental exams once or twice a year.  Routine eye exams. Ask your health care provider how often you should have your eyes checked.  Personal lifestyle choices, including: ? Daily care of your teeth and gums. ? Regular physical activity. ? Eating a healthy diet. ? Avoiding tobacco and drug use. ? Limiting alcohol use. ? Practicing safe sex. ? Taking low-dose aspirin every day. ? Taking vitamin and mineral supplements as recommended by your health care provider. What happens during an annual well check? The services and screenings done by your health care provider during your annual well check will depend on your age, overall health, lifestyle risk factors, and family history of disease. Counseling Your health care provider may ask you questions about your:  Alcohol use.  Tobacco use.  Drug use.  Emotional well-being.  Home and relationship well-being.  Sexual activity.  Eating habits.  History of falls.  Memory and ability to understand (cognition).  Work and work Statistician.  Reproductive health.  Screening You may have the following tests or  measurements:  Height, weight, and BMI.  Blood pressure.  Lipid and cholesterol levels. These may be checked every 5 years, or more frequently if you are over 67 years old.  Skin check.  Lung cancer screening. You may have this screening every year starting at age 86 if you have a 30-pack-year history of smoking and currently smoke or have quit within the past 15 years.  Fecal occult blood test (FOBT) of the stool. You may have this test every year starting at age 88.  Flexible sigmoidoscopy or colonoscopy. You may have a sigmoidoscopy every 5 years or a colonoscopy every 10 years starting at age 71.  Hepatitis C blood test.  Hepatitis B blood test.  Sexually transmitted disease (STD) testing.  Diabetes screening. This is done by checking your blood sugar (glucose) after you have not eaten for a while (fasting). You may have this done every 1-3 years.  Bone density scan. This is done to screen for osteoporosis. You may have this done starting at age 63.  Mammogram. This may be done every 1-2 years. Talk to your health care provider about how often you should have regular mammograms.  Talk with your health care provider about your test results, treatment options, and if necessary, the need for more tests. Vaccines Your health care provider may recommend certain vaccines, such as:  Influenza vaccine. This is recommended every year.  Tetanus, diphtheria, and acellular pertussis (Tdap, Td) vaccine. You may need a Td booster every 10 years.  Varicella vaccine. You may need this if you have not been vaccinated.  Zoster vaccine. You may need this after  age 10.  Measles, mumps, and rubella (MMR) vaccine. You may need at least one dose of MMR if you were born in 1957 or later. You may also need a second dose.  Pneumococcal 13-valent conjugate (PCV13) vaccine. One dose is recommended after age 29.  Pneumococcal polysaccharide (PPSV23) vaccine. One dose is recommended after age  40.  Meningococcal vaccine. You may need this if you have certain conditions.  Hepatitis A vaccine. You may need this if you have certain conditions or if you travel or work in places where you may be exposed to hepatitis A.  Hepatitis B vaccine. You may need this if you have certain conditions or if you travel or work in places where you may be exposed to hepatitis B.  Haemophilus influenzae type b (Hib) vaccine. You may need this if you have certain conditions.  Talk to your health care provider about which screenings and vaccines you need and how often you need them. This information is not intended to replace advice given to you by your health care provider. Make sure you discuss any questions you have with your health care provider. Document Released: 09/27/2015 Document Revised: 05/20/2016 Document Reviewed: 07/02/2015 Elsevier Interactive Patient Education  [employer].

## 2018-02-18 NOTE — Assessment & Plan Note (Signed)
Depression screen negative. Health Maintenance reviewed. Declines all immunizations. Preventive schedule discussed and handout given in AVS. Will obtain fasting labs today.

## 2018-02-18 NOTE — Assessment & Plan Note (Signed)
DEXA scan to further assess. Recent Vitamin D levels within normal limits.

## 2018-02-18 NOTE — Progress Notes (Signed)
Patient presents to clinic today for annual exam.  Patient is fasting for labs. Patient has had recent labs with her integrative medicine provider ([facility]). Has brought in a copy of these labs today for review.   Health Maintenance: Immunizations -- Declines tetanus and pneumonia vaccine.   Colonoscopy -- Overdue. Declines colonoscopy. Would like to do the Cologuard as this was discussed at acute visit earlier this week.  .  Mammogram -- Due. Has been 10 years or more. Denies concerns in the area. Agrees to screening mammogram. .  PAP -- Unsure of Last PAP smear. No history of abnormal PAP smear. Agrees to PAP but would like to see GYN. .  Bone Density -- Overdue. No history of pathological fracture. Had recent Vitamin D level with her Integrative medicine specialist.  Past Medical History:  Diagnosis Date  . Anxiety   . Arthritis   . GERD (gastroesophageal reflux disease)   . Hypertension   . Thyroid disease     Past Surgical History:  Procedure Laterality Date  . COLONOSCOPY  10 years ago    in [place]-normal exam   . HAND SURGERY Right   . INDUCED ABORTION    . TONSILLECTOMY      Current Outpatient Medications on File Prior to Visit  Medication Sig Dispense Refill  . ALOE VERA CONCENTRATE PO Take 2 Doses by mouth daily.    . Cholecalciferol (VITAMIN D3) 5000 units CAPS Take 1 capsule by mouth daily.    Marland Kitchen. co-enzyme Q-10 30 MG capsule Take 30 mg by mouth 3 (three) times daily.    . COCONUT OIL PO Take 1 capsule by mouth daily.    Marland Kitchen. omega-3 fish oil (MAXEPA) 1000 MG CAPS capsule Take 1 capsule by mouth daily.    Marland Kitchen. OVER THE COUNTER MEDICATION Digestive Ezymes - 9 daily    . Turmeric 400 MG CAPS Take 1 capsule by mouth daily.     No current facility-administered medications on file prior to visit.     No Known Allergies  Family History  Problem Relation Age of Onset  . Lung cancer Mother   . Colon cancer Father [id number]  . Diabetes Sister   . Heart  disease Brother   . Emphysema Brother   . Esophageal cancer Neg Hx   . Stomach cancer Neg Hx     Social History   Socioeconomic History  . Marital status: Married    Spouse name: Not on file  . Number of children: 4  . Years of education: Not on file  . Highest education level: Not on file  Occupational History  . Occupation: retired  Engineer, productionocial Needs  . Financial resource strain: Not on file  . Food insecurity:    Worry: Not on file    Inability: Not on file  . Transportation needs:    Medical: Not on file    Non-medical: Not on file  Tobacco Use  . Smoking status: Former Games developermoker  . Smokeless tobacco: Never Used  . Tobacco comment: early 20's  Substance and Sexual Activity  . Alcohol use: No    Frequency: Never  . Drug use: No  . Sexual activity: Yes  Lifestyle  . Physical activity:    Days per week: Not on file    Minutes per session: Not on file  . Stress: Not on file  Relationships  . Social connections:    Talks on phone: Not on file    Gets together: Not on  file    Attends religious service: Not on file    Active member of club or organization: Not on file    Attends meetings of clubs or organizations: Not on file    Relationship status: Not on file  . Intimate partner violence:    Fear of current or ex partner: Not on file    Emotionally abused: Not on file    Physically abused: Not on file    Forced sexual activity: Not on file  Other Topics Concern  . Not on file  Social History Narrative  . Not on file   Review of Systems  Constitutional: Negative for fever and weight loss.  HENT: Negative for ear discharge, ear pain, hearing loss and tinnitus.   Eyes: Negative for blurred vision, double vision, photophobia and pain.  Respiratory: Negative for cough and shortness of breath.   Cardiovascular: Negative for chest pain and palpitations.  Gastrointestinal: Negative for abdominal pain, blood in stool, constipation, diarrhea, heartburn, melena, nausea and  vomiting.  Genitourinary: Negative for dysuria, flank pain, frequency, hematuria and urgency.  Musculoskeletal: Negative for falls.  Neurological: Negative for dizziness, loss of consciousness and headaches.  Endo/Heme/Allergies: Negative for environmental allergies.  Psychiatric/Behavioral: Negative for depression, hallucinations, substance abuse and suicidal ideas. The patient is not nervous/anxious and does not have insomnia.    BP 112/78   Pulse (!) 55   Temp (!) 97.5 F (36.4 C) (Oral)   Resp 14   Ht 5\' 3"  (1.6 m)   Wt 165 lb (74.8 kg)   SpO2 98%   BMI 29.23 kg/m   Physical Exam  Constitutional: She is oriented to person, place, and time.  HENT:  Head: Normocephalic and atraumatic.  Right Ear: Tympanic membrane, external ear and ear canal normal.  Left Ear: Tympanic membrane, external ear and ear canal normal.  Nose: Nose normal. No mucosal edema.  Mouth/Throat: Uvula is midline, oropharynx is clear and moist and mucous membranes are normal. No oropharyngeal exudate or posterior oropharyngeal erythema.  Eyes: Pupils are equal, round, and reactive to light. Conjunctivae are normal.  Neck: Neck supple. No thyromegaly present.  Cardiovascular: Normal rate, regular rhythm, normal heart sounds and intact distal pulses.  Pulmonary/Chest: Effort normal and breath sounds normal. No respiratory distress. She has no wheezes. She has no rales.  Abdominal: Soft. Bowel sounds are normal. She exhibits no distension and no mass. There is no tenderness. There is no rebound and no guarding.  Lymphadenopathy:    She has no cervical adenopathy.  Neurological: She is alert and oriented to person, place, and time. No cranial nerve deficit.  Skin: Skin is warm and dry. No rash noted.  Vitals reviewed.  Assessment/Plan: Abnormal TSH Mildly elevated at 4.9 on labs from integrative specialist. Repeat TSH and Free t4 today.   Abnormal cortisol level Noted on labs from specialist. Will check AM  cortisol level today.  Breast cancer screening Declines exam today. Wishes to she GYN. Will place order for screening mammogram.   Cervical cancer screening Due. Denies concerns. Is being set up with GYN.   Postmenopausal estrogen deficiency DEXA scan to further assess. Recent Vitamin D levels within normal limits.   Visit for preventive health examination Depression screen negative. Health Maintenance reviewed. Declines all immunizations. Preventive schedule discussed and handout given in AVS. Will obtain fasting labs today.     Piedad Climes, PA-C

## 2018-02-18 NOTE — Assessment & Plan Note (Signed)
Noted on labs from specialist. Will check AM cortisol level today.

## 2018-02-18 NOTE — Telephone Encounter (Signed)
Spoke with pt and she is aware of appt but states she will be out of town that date. Pt instructed to call the office to reschedule the appt to a date that works for her. Pt verbalized understanding.

## 2018-02-21 LAB — CORTISOL-AM, BLOOD: CORTISOL - AM: 14.8 ug/dL

## 2018-02-24 ENCOUNTER — Ambulatory Visit: Payer: Self-pay | Admitting: Obstetrics & Gynecology

## 2018-02-24 ENCOUNTER — Ambulatory Visit (INDEPENDENT_AMBULATORY_CARE_PROVIDER_SITE_OTHER): Payer: BLUE CROSS/BLUE SHIELD | Admitting: Obstetrics & Gynecology

## 2018-02-24 ENCOUNTER — Encounter: Payer: Self-pay | Admitting: Obstetrics & Gynecology

## 2018-02-24 VITALS — BP 128/76 | Ht 61.25 in | Wt 164.0 lb

## 2018-02-24 DIAGNOSIS — Z13820 Encounter for screening for osteoporosis: Secondary | ICD-10-CM

## 2018-02-24 DIAGNOSIS — Z01419 Encounter for gynecological examination (general) (routine) without abnormal findings: Secondary | ICD-10-CM | POA: Diagnosis not present

## 2018-02-24 DIAGNOSIS — Z1151 Encounter for screening for human papillomavirus (HPV): Secondary | ICD-10-CM

## 2018-02-24 DIAGNOSIS — Z780 Asymptomatic menopausal state: Secondary | ICD-10-CM

## 2018-02-24 NOTE — Progress Notes (Signed)
Haley Ritter 05/16/1951 [id number]   History:    67 y.o. [id number] Married.  RP:  New patient presenting for annual gyn exam   HPI: Menopause, well on no HRT currently.  No PMB.  Visit with Integrative medicine, after lab panel, prescribed Estrogen/Progesterone/Testo.  Took for a month, but didn't feel well on those and stopped.  Abstinent, husband with erectile dysfunction.  Intermittent mid pelvic pain, possibly associated with lower back disc issues per patient.  Urine/BMs wnl.  Breasts wnl.  BMI 30.74. Physically active.    Past medical history,surgical history, family history and social history were all reviewed and documented in the EPIC chart.  Gynecologic History No LMP recorded. Patient is postmenopausal. Contraception: post menopausal status Last Pap: 2017. Results were: normal per patient Last mammogram: 10 yrs ago. [facility] Bone Density: F/U here for Bone Density. Colonoscopy: Scheduling through her Fam MD.  Obstetric History OB History  Gravida Para Term Preterm AB Living  5 4     1 4   SAB TAB Ectopic Multiple Live Births               # Outcome Date GA Lbr Len/2nd Weight Sex Delivery Anes PTL Lv  5 AB           4 Para           3 Para           2 Para           1 Para              ROS: A ROS was performed and pertinent positives and negatives are included in the history.  GENERAL: No fevers or chills. HEENT: No change in vision, no earache, sore throat or sinus congestion. NECK: No pain or stiffness. CARDIOVASCULAR: No chest pain or pressure. No palpitations. PULMONARY: No shortness of breath, cough or wheeze. GASTROINTESTINAL: No abdominal pain, nausea, vomiting or diarrhea, melena or bright red blood per rectum. GENITOURINARY: No urinary frequency, urgency, hesitancy or dysuria. MUSCULOSKELETAL: No joint or muscle pain, no back pain, no recent trauma. DERMATOLOGIC: No rash, no itching, no lesions. ENDOCRINE: No polyuria,  polydipsia, no heat or cold intolerance. No recent change in weight. HEMATOLOGICAL: No anemia or easy bruising or bleeding. NEUROLOGIC: No headache, seizures, numbness, tingling or weakness. PSYCHIATRIC: No depression, no loss of interest in normal activity or change in sleep pattern.     Exam:   BP 128/76   Ht 5' 1.25" (1.556 m)   Wt 164 lb (74.4 kg)   BMI 30.74 kg/m   Body mass index is 30.74 kg/m.  General appearance : Well developed well nourished female. No acute distress HEENT: Eyes: no retinal hemorrhage or exudates,  Neck supple, trachea midline, no carotid bruits, no thyroidmegaly Lungs: Clear to auscultation, no rhonchi or wheezes, or rib retractions  Heart: Regular rate and rhythm, no murmurs or gallops Breast:Examined in sitting and supine position were symmetrical in appearance, no palpable masses or tenderness,  no skin retraction, no nipple inversion, no nipple discharge, no skin discoloration, no axillary or supraclavicular lymphadenopathy Abdomen: no palpable masses or tenderness, no rebound or guarding Extremities: no edema or skin discoloration or tenderness  Pelvic: Vulva: Normal             Vagina: No gross lesions or discharge  Cervix: No gross lesions or discharge.  Pap/HR HPV   Uterus  AV, normal size, shape and consistency, non-tender  and mobile  Adnexa  Without masses or tenderness  Anus: Normal   Assessment/Plan:  67 y.o. female for annual exam   1. Encounter for routine gynecological examination with Papanicolaou smear of cervix Normal gynecologic exam.  Pap with high risk HPV done.  Breast exam normal.  Will schedule screening mammogram now at the breast center.  Will schedule screening colonoscopy and bone density through her family physician.  Health labs with family physician.  2. Menopause present Well on no hormone replacement therapy.  No postmenopausal bleeding.  3. Screening for osteoporosis Vitamin D supplements, calcium rich nutrition and  regular weightbearing physical activity recommended.  [facility]uling [facility] density at the [facility].  Genia Del MD, 11:27 AM 02/24/2018

## 2018-02-25 ENCOUNTER — Encounter: Payer: Self-pay | Admitting: Obstetrics & Gynecology

## 2018-02-25 LAB — PAP, TP IMAGING W/ HPV RNA, RFLX HPV TYPE 16,18/45: HPV DNA High Risk: NOT DETECTED

## 2018-02-25 NOTE — Patient Instructions (Signed)
1. Encounter for routine gynecological examination with Papanicolaou smear of cervix Normal gynecologic exam.  Pap with high risk HPV done.  Breast exam normal.  Will schedule screening mammogram now at the breast center.  Will schedule screening colonoscopy and bone density through her family physician.  Health labs with family physician.  2. Menopause present Well on no hormone replacement therapy.  No postmenopausal bleeding.  3. Screening for osteoporosis Vitamin D supplements, calcium rich nutrition and regular weightbearing physical activity recommended.  Scheduling bone density at the [facility].  Haley Ritter, it was a pleasure meeting you today!  I will inform you of your results as soon as they are available.

## 2018-03-01 LAB — COLOGUARD: COLOGUARD: NEGATIVE

## 2018-03-10 NOTE — Progress Notes (Signed)
Cardiology Office Note   Date:  03/11/2018   ID:  Haley Ritter, DOB 11-09-1950, MRN [id number]  PCP:  Waldon Merl, PA-C  Cardiologist:   No primary care provider on file. Referring:  GI MD  Chief Complaint  Patient presents with  . Chest Pain      History of Present Illness: Haley Ritter is a 67 y.o. female who is referred by by a gastroenterologist for evaluation of chest pain.   About 3 weeks ago the patient was going to have a colon prep.  She was drinking the fluid and she noted palpitations.  Her chest was fluttering.  She also had pressure.  It was heavy.  Was intermittent chest.  She called the GI doctor and was told to stop taking the prep and that she would need to see a cardiologist before any other GI work-up.  She was not describing jaw or arm discomfort.  She was not describing associated shortness of breath, PND or orthopnea.  She is had this kind of discomfort before and was thinking it was esophageal.  She is had food intolerances and allergies.  She did have a treadmill test about 10 years ago and has been diagnosed with panic which was situational at that time.  She does do some yoga and walking and takes care of 2 young grandchildren at times.  She does not have overt symptoms associated with this.  She does have some back pain which is slightly limiting.      Past Medical History:  Diagnosis Date  . Abnormal cortisol level 02/18/2018  . Allergic rhinitis due to animal hair and dander 10/12/2017  . Anxiety   . Arthritis   . GERD (gastroesophageal reflux disease)   . Hypertension   . Thyroid disease     Past Surgical History:  Procedure Laterality Date  . COLONOSCOPY  10 years ago    in [place]-normal exam   . HAND SURGERY Right   . INDUCED ABORTION    . TONSILLECTOMY       Current Outpatient Medications  Medication Sig Dispense Refill  . Apple Cider Vinegar 188 MG CAPS Take by mouth.    . Cholecalciferol (VITAMIN D3) 5000 units CAPS Take 1 capsule by  mouth daily.    Marland Kitchen co-enzyme Q-10 30 MG capsule Take 30 mg by mouth 3 (three) times daily.    . magnesium 30 MG tablet Take 30 mg by mouth 2 (two) times daily.    Marland Kitchen omega-3 fish oil (MAXEPA) 1000 MG CAPS capsule Take 1 capsule by mouth daily.    Marland Kitchen OVER THE COUNTER MEDICATION Digestive Ezymes - 9 daily    . Turmeric 400 MG CAPS Take 1 capsule by mouth daily.    . vitamin A 40981 UNIT capsule Take 10,000 Units by mouth daily.     No current facility-administered medications for this visit.     Allergies:   Patient has no known allergies.    Social History:  The patient  reports that she has never smoked. She has never used smokeless tobacco. She reports that she does not drink alcohol or use drugs.   Family History:  The patient's family history includes Colon cancer (age of onset: 5) in her father; Diabetes in her sister; Emphysema in her brother; Heart disease in her brother; Lung cancer in her mother.    ROS:  Please see the history of present illness.   Otherwise, review of systems are positive for none.  All other systems are reviewed and negative.    PHYSICAL EXAM: VS:  BP 128/76   Pulse (!) 58   Ht 5\' 1"  (1.549 m)   Wt 165 lb 6.4 oz (75 kg)   BMI 31.25 kg/m  , BMI Body mass index is 31.25 kg/m. GENERAL:  Well appearing HEENT:  Pupils equal round and reactive, fundi not visualized, oral mucosa unremarkable NECK:  No jugular venous distention, waveform within normal limits, carotid upstroke brisk and symmetric, no bruits, no thyromegaly LYMPHATICS:  No cervical, inguinal adenopathy LUNGS:  Clear to auscultation bilaterally BACK:  No CVA tenderness CHEST:  Unremarkable HEART:  PMI not displaced or sustained,S1 and S2 within normal limits, no S3, no S4, no clicks, no rubs, no murmurs ABD:  Flat, positive bowel sounds normal in frequency in pitch, no bruits, no rebound, no guarding, no midline pulsatile mass, no hepatomegaly, no splenomegaly EXT:  2 plus pulses throughout, no  edema, no cyanosis no clubbing SKIN:  No rashes no nodules NEURO:  Cranial nerves II through XII grossly intact, motor grossly intact throughout PSYCH:  Cognitively intact, oriented to person place and time    EKG:  EKG is ordered today. The ekg ordered today demonstrates normal sinus rhythm, rate 58, axis within normal limits, intervals within normal limits, no acute ST-T wave changes.   Recent Labs: 02/18/2018: TSH 3.50    Lipid Panel    Component Value Date/Time   CHOL 239 (A) 10/04/2015   TRIG 70 10/04/2015   HDL 53 10/04/2015   LDLCALC 172 10/04/2015      Wt Readings from Last 3 Encounters:  03/11/18 165 lb 6.4 oz (75 kg)  02/24/18 164 lb (74.4 kg)  02/18/18 165 lb (74.8 kg)      Other studies Reviewed: Additional studies/ records that were reviewed today include: . Review of the above records demonstrates:  Please see elsewhere in the note.     ASSESSMENT AND PLAN:  CHEST PAIN:   The chest pain is atypical.  Pretest probability is low.  I will bring the patient back for a POET (Plain Old Exercise Test). This will allow me to screen for obstructive coronary disease, risk stratify and very importantly provide a prescription for exercise.    DYSLIPIDEMIA:  LDL was 178 last year with HDL of 68.   She will try diet.   I recommended the Mediterranean diet.  She will repeat the lipid profile in about 6 months.   Current medicines are reviewed at length with the patient today.  The patient does not have concerns regarding medicines.  The following changes have been made:  no change  Labs/ tests ordered today include:   Orders Placed This Encounter  Procedures  . EXERCISE TOLERANCE TEST (ETT)     Disposition:   FU with me as needed.      Signed, Rollene RotundaJames Savva Beamer, MD  03/11/2018 9:00 AM    [facility]

## 2018-03-11 ENCOUNTER — Ambulatory Visit (INDEPENDENT_AMBULATORY_CARE_PROVIDER_SITE_OTHER): Payer: BLUE CROSS/BLUE SHIELD | Admitting: Cardiology

## 2018-03-11 ENCOUNTER — Ambulatory Visit (HOSPITAL_COMMUNITY)
Admission: RE | Admit: 2018-03-11 | Discharge: 2018-03-11 | Disposition: A | Discharge status: Home / Self Care | Payer: BLUE CROSS/BLUE SHIELD | Source: Ambulatory Visit | Attending: Cardiovascular Disease | Admitting: Cardiovascular Disease

## 2018-03-11 ENCOUNTER — Encounter: Payer: Self-pay | Admitting: Cardiology

## 2018-03-11 ENCOUNTER — Other Ambulatory Visit: Payer: Self-pay

## 2018-03-11 VITALS — BP 128/76 | HR 58 | Ht 61.0 in | Wt 165.4 lb

## 2018-03-11 DIAGNOSIS — Z1211 Encounter for screening for malignant neoplasm of colon: Secondary | ICD-10-CM

## 2018-03-11 DIAGNOSIS — R072 Precordial pain: Secondary | ICD-10-CM

## 2018-03-11 DIAGNOSIS — E785 Hyperlipidemia, unspecified: Secondary | ICD-10-CM | POA: Diagnosis not present

## 2018-03-11 LAB — EXERCISE TOLERANCE TEST
CHL CUP RESTING HR STRESS: 60 {beats}/min
CHL RATE OF PERCEIVED EXERTION: 19
Estimated workload: 10.9 METS
Exercise duration (min): 9 min
Exercise duration (sec): 30 s
MPHR: 154 {beats}/min
Peak HR: 144 {beats}/min
Percent HR: 93 %

## 2018-03-11 NOTE — Patient Instructions (Signed)
Medication Instructions:  Continue current medications  If you need a refill on your cardiac medications before your next appointment, please call your pharmacy.  Labwork: None Ordered   Testing/Procedures: Your physician has requested that you have an exercise tolerance test. For further information please visit [email or web address]. Please also follow instruction sheet, as given.   Follow-Up: Your physician wants you to follow-up in: As Needed.     Thank you for choosing [facility]!!       

## 2018-03-14 NOTE — Addendum Note (Signed)
Addended by: Chana BodeGREEN, Katelynne Revak L on: 03/14/2018 04:34 PM   Modules accepted: Orders

## 2018-04-27 ENCOUNTER — Ambulatory Visit
Admission: RE | Admit: 2018-04-27 | Discharge: 2018-04-27 | Disposition: A | Discharge status: Home / Self Care | Payer: BLUE CROSS/BLUE SHIELD | Source: Ambulatory Visit | Attending: Physician Assistant | Admitting: Physician Assistant

## 2018-04-27 DIAGNOSIS — Z780 Asymptomatic menopausal state: Secondary | ICD-10-CM

## 2018-04-27 DIAGNOSIS — Z1239 Encounter for other screening for malignant neoplasm of breast: Secondary | ICD-10-CM

## 2018-05-03 ENCOUNTER — Ambulatory Visit: Payer: BLUE CROSS/BLUE SHIELD | Admitting: Physician Assistant

## 2018-05-03 DIAGNOSIS — Z0289 Encounter for other administrative examinations: Secondary | ICD-10-CM

## 2018-06-06 ENCOUNTER — Encounter: Payer: Self-pay | Admitting: Physician Assistant

## 2018-06-06 ENCOUNTER — Encounter

## 2018-06-06 ENCOUNTER — Ambulatory Visit (INDEPENDENT_AMBULATORY_CARE_PROVIDER_SITE_OTHER): Payer: BLUE CROSS/BLUE SHIELD | Admitting: Physician Assistant

## 2018-06-06 VITALS — BP 126/78 | HR 65 | Ht 62.0 in | Wt 165.0 lb

## 2018-06-06 DIAGNOSIS — Z1211 Encounter for screening for malignant neoplasm of colon: Secondary | ICD-10-CM | POA: Diagnosis not present

## 2018-06-06 DIAGNOSIS — K219 Gastro-esophageal reflux disease without esophagitis: Secondary | ICD-10-CM

## 2018-06-06 DIAGNOSIS — R1010 Upper abdominal pain, unspecified: Secondary | ICD-10-CM

## 2018-06-06 NOTE — Progress Notes (Addendum)
Subjective:    Patient ID: Haley Ritter, female    DOB: Jul 27, 1951, 67 y.o.   MRN: [id number]  HPI Haley Ritter is a pleasant 67 year old white female, known to Dr. Rhea Belton and myself.  She was initially seen in February 2019 as a new patient with complaints of epigastric and subxiphoid pain as well as heartburn.  It was felt her symptoms were consistent with GERD exacerbated by a viral upper respiratory infection.  Rule out gastritis/peptic ulcer disease. She also has family history of colon cancer in her father.  Patient did have one prior colonoscopy 10+ years ago in [place]. She did not want to be placed on a PPI and was therefore started on Zantac on her 50 mg twice a day and antireflux regimen.  We discussed proceeding with EGD if symptoms were persisting and also discussed that she should have follow-up colonoscopy especially with her family history.  He was to follow-up in 2 months. She comes back now with ongoing symptoms describes a particularly bad episode she had a couple of weeks ago while on vacation at the beach.  She had eaten clams with butter and garlic and had some white wine and later that evening had which she describes as horrible heartburn and reflux.  She says she thought she was going to die.  She had some persistence of symptoms and burning for about 2 weeks which is gradually improved.  She never took any Mylanta Tums or Zantac during this episode. Since then she has decided to go on a got reset type of diet.  She is taking digestive enzymes, probiotic some cod liver oil and is boiling a whole chicken to make a bone broth and then adding some vegetables and eating this 3 times a day.  He is drinking goats milk for breakfast.  Patient states that she had a terrible reaction to the prep he was given for colonoscopy developed headache ,flulike symptoms and chest pressure between her breasts radiating into the back.  She had taken a Suprep Colonoscopy was canceled and she was advised to  have a cardiology evaluation to make sure symptoms were not cardiac. She has since had consultation with Dr. Antoine Poche occluding stress testing and has been told that everything was normal..  She tells me that she really never took the Zantac that was prescribed.  She has a fear of taking medications, and is very anxious about considering EGD or colonoscopy at this point and asked multiple questions about complications and says " can'tpeople die from that". She is also anxious about imaging. It sounds as if she is been doing a lot of reading online and says that she is red where she probably has not enough acid rather than too much acid driving her problems.  [name] Also complaining of some migrating abdominal pains off and on over the past few months, no real changes in bowel habits no melena or hematochezia.  She did a Cologuard through her PCP which was negative.  Review of Systems Pertinent positive and negative review of systems were noted in the above HPI section.  All other review of systems was otherwise negative.  Outpatient Encounter Medications as of 06/06/2018  Medication Sig  . magnesium 30 MG tablet Take 30 mg by mouth 2 (two) times daily.  Marland Kitchen omega-3 fish oil (MAXEPA) 1000 MG CAPS capsule Take 1 capsule by mouth daily.  Marland Kitchen OVER THE COUNTER MEDICATION Digestive Ezymes - 9 daily  . Probiotic Product (PROBIOTIC DAILY PO) Take by  mouth.  . [DISCONTINUED] Apple Cider Vinegar 188 MG CAPS Take by mouth.  . [DISCONTINUED] Cholecalciferol (VITAMIN D3) 5000 units CAPS Take 1 capsule by mouth daily.  . [DISCONTINUED] co-enzyme Q-10 30 MG capsule Take 30 mg by mouth 3 (three) times daily.  . [DISCONTINUED] Turmeric 400 MG CAPS Take 1 capsule by mouth daily.  . [DISCONTINUED] vitamin A 1610910000 UNIT capsule Take 10,000 Units by mouth daily.   No facility-administered encounter medications on file as of 06/06/2018.    No Known Allergies Patient Active Problem List   Diagnosis Date Noted  .  Abnormal TSH 02/18/2018  . Abnormal cortisol level 02/18/2018  . Breast cancer screening 02/18/2018  . Cervical cancer screening 02/18/2018  . Postmenopausal estrogen deficiency 02/18/2018  . Visit for preventive health examination 02/18/2018  . Allergic rhinitis due to animal hair and dander 10/12/2017  . GERD (gastroesophageal reflux disease) 09/21/2017   Social History   Socioeconomic History  . Marital status: Married    Spouse name: Not on file  . Number of children: 4  . Years of education: Not on file  . Highest education level: Not on file  Occupational History  . Occupation: retired  Engineer, productionocial Needs  . Financial resource strain: Not on file  . Food insecurity:    Worry: Not on file    Inability: Not on file  . Transportation needs:    Medical: Not on file    Non-medical: Not on file  Tobacco Use  . Smoking status: Never Smoker  . Smokeless tobacco: Never Used  . Tobacco comment: early 20's  Substance and Sexual Activity  . Alcohol use: No    Frequency: Never  . Drug use: No  . Sexual activity: Not Currently    Partners: Male    Comment: 1st intercourse- 17, partners- greater than 10, married- 30 yrs   Lifestyle  . Physical activity:    Days per week: Not on file    Minutes per session: Not on file  . Stress: Not on file  Relationships  . Social connections:    Talks on phone: Not on file    Gets together: Not on file    Attends religious service: Not on file    Active member of club or organization: Not on file    Attends meetings of clubs or organizations: Not on file    Relationship status: Not on file  . Intimate partner violence:    Fear of current or ex partner: Not on file    Emotionally abused: Not on file    Physically abused: Not on file    Forced sexual activity: Not on file  Other Topics Concern  . Not on file  Social History Narrative  . Not on file    Haley Ritter's family history includes Colon cancer (age of onset: 6081) in her father;  Diabetes in her sister; Emphysema in her brother; Heart disease in her brother; Lung cancer in her mother.      Objective:    Vitals:   06/06/18 1403  BP: 126/78  Pulse: 65    Physical Exam; well-developed older white female in no acute distress, accompanied by her husband blood pressure 126/78 pulse 65, height 5 foot 2, weight 165.  HEENT; nontraumatic normocephalic EOMI PERRLA sclera anicteric oral mucosa is moist, Cardiovascular ;regular rate and rhythm with S1-S2 no murmur rub or gallop, Pulmonary ;clear bilaterally, Abdomen ;soft, she has mild tenderness in the epigastrium there is no guarding or rebound no palpable  mass or hepatosplenomegaly no distention bowel sounds present, Rectal ;exam not done, Extremities; no clubbing cyanosis or edema skin warm and dry, Neuro psych; alert and oriented, grossly nonfocal. mood and affect appropriate-she is anxious and tearful at times       Assessment & Plan:   #26 67 year old white female with chronic GERD. Patient is reluctant to take medications and reluctant to have other diagnostic evaluation.  She is asking about the least invasive types of work-up.   We discussed 24-hour pH study, EGD, and barium swallow.  She is currently on a very limited diet for 1 month with intention to reset her gut function.  #2 colon cancer screening-last colonoscopy greater than 10 years ago. Patient did Cologuard a few months ago which was negative  Patient has personal family history of colon cancer in her father. Discussed that Cologuard is not intended to be used in patients with family history, and certainly not in lieu of colonoscopy. Patient had reaction to the Suprep that she tried taking earlier this year and is very anxious about pursuing a colonoscopy and/or taking another prep at this point. We discussed CT imaging and/or CT colonography She is anxious about complications of CT  #3 intermittent abdominal pain somewhat migratory-probable  IBS/etiology not clear  #4 chronic anxiety  Plan; After long discussion with the patient and her husband regarding options, she is agreeable to pursue barium swallow and upper abdominal ultrasound.  These will both be scheduled.  We will continue an antireflux diet Advised her to get Pepcid AC, and may take 2 twice daily either regularly or as needed for episodes of reflux heartburn indigestion.  She may also try Mylanta or Gaviscon.  We will not schedule EGD and colonoscopy at this time. Greater than 50% of visit was spent in counseling and discussion regarding GI issues , and options for evaluation as described above.  Amy Oswald Hillock PA-C 06/06/2018   Cc: Waldon Merl, PA-C  Addendum: Reviewed and agree with management. Pyrtle, Carie Caddy, MD

## 2018-06-06 NOTE — Patient Instructions (Signed)
We have provided you with an antireflux diet handout. Get Pepcid over the counter , take 2 tablets twice daily as needed.  You may also try Gaviscon or Mylanta for any heart burn episodes.   You have been [facility] abdominal [facility] on Wed 06-22-2018  at 9:00 am. Please arrive at 8:45 am to your appointment for registration. Make certain not to have anything to eat or drink 6 hours prior to your appointment. Should you need to reschedule your appointment, please contact radiology at [phone]. This test typically takes about 30 minutes to perform.  You have been scheduled for a Barium Esophogram [facility] (1st floor of the hospital) on 06-22-2018 at 9:00 am. Please arrive at 8:45 am to your appointment for registration. Make certain not to have anything to eat or drink 3 hours prior to your test. If you need to reschedule for any reason, please contact radiology at [phone] to do so. __________________________________________________________________ A barium swallow is an examination that concentrates on views of the esophagus. This tends to be a double contrast exam (barium and two liquids which, when combined, create a gas to distend the wall of the oesophagus) or single contrast (non-ionic iodine based). The study is usually tailored to your symptoms so a good history is essential. Attention is paid during the study to the form, structure and configuration of the esophagus, looking for functional disorders (such as aspiration, dysphagia, achalasia, motility and reflux) EXAMINATION You may be asked to change into a gown, depending on the type of swallow being performed. A radiologist and radiographer will perform the procedure. The radiologist will advise you of the type of contrast selected for your procedure and direct you during the exam. You will be asked to stand, sit or lie in several different positions and to hold a  small amount of fluid in your mouth before being asked to swallow while the imaging is performed .In some instances you may be asked to swallow barium coated marshmallows to assess the motility of a solid food bolus. The exam can be recorded as a digital or video fluoroscopy procedure. POST PROCEDURE It will take 1-2 days for the barium to pass through your system. To facilitate this, it is important, unless otherwise directed, to increase your fluids for the next 24-48hrs and to resume your normal diet.  This test typically takes about 30 minutes to perform.  Normal BMI (Body Mass Index- based on height and weight) is between 23 and 30. Your BMI today is Body mass index is 30.18 kg/m. Marland Kitchen. Please consider follow up  regarding your BMI with your Primary Care Provider. __________________________________________________________________________________

## 2018-06-22 ENCOUNTER — Ambulatory Visit (HOSPITAL_COMMUNITY): Payer: BLUE CROSS/BLUE SHIELD

## 2018-06-22 ENCOUNTER — Other Ambulatory Visit (HOSPITAL_COMMUNITY): Payer: BLUE CROSS/BLUE SHIELD

## 2018-07-12 ENCOUNTER — Ambulatory Visit (HOSPITAL_COMMUNITY)
Admission: RE | Admit: 2018-07-12 | Discharge: 2018-07-12 | Disposition: A | Discharge status: Home / Self Care | Payer: BLUE CROSS/BLUE SHIELD | Source: Ambulatory Visit | Attending: Physician Assistant | Admitting: Physician Assistant

## 2018-07-12 DIAGNOSIS — K219 Gastro-esophageal reflux disease without esophagitis: Secondary | ICD-10-CM | POA: Insufficient documentation

## 2018-07-12 DIAGNOSIS — R1010 Upper abdominal pain, unspecified: Secondary | ICD-10-CM

## 2018-07-12 DIAGNOSIS — K225 Diverticulum of esophagus, acquired: Secondary | ICD-10-CM | POA: Insufficient documentation

## 2019-01-04 ENCOUNTER — Other Ambulatory Visit: Payer: Self-pay

## 2019-01-04 ENCOUNTER — Encounter: Payer: Self-pay | Admitting: Physician Assistant

## 2019-01-04 ENCOUNTER — Ambulatory Visit (INDEPENDENT_AMBULATORY_CARE_PROVIDER_SITE_OTHER): Payer: BLUE CROSS/BLUE SHIELD | Admitting: Physician Assistant

## 2019-01-04 VITALS — BP 134/79 | HR 64 | Temp 97.4°F | Ht 63.0 in | Wt 178.0 lb

## 2019-01-04 DIAGNOSIS — K219 Gastro-esophageal reflux disease without esophagitis: Secondary | ICD-10-CM

## 2019-01-04 MED ORDER — PANTOPRAZOLE SODIUM 40 MG PO TBEC: 40.0000 mg | DELAYED_RELEASE_TABLET | Freq: Every day | ORAL | 0 refills | Status: DC

## 2019-01-04 NOTE — Patient Instructions (Signed)
Instructions sent to MyChart.   Please start the daily saline nasal rinses to help with nasal congestion and post-nasal drip. Please reconsider trying an over-the-counter nasal steroid like Flonase or Nasacort as I feel these will help. Start the 2-week trial of the Protonix, taking once daily without fail. You will receive a call from Dr. Haywood Pao office to schedule an appointment. Follow-up with me in 2 weeks via video visit if you have not seen the specialist by then.

## 2019-01-04 NOTE — Progress Notes (Signed)
I have discussed the procedure for the virtual visit with the patient who has given consent to proceed with assessment and treatment.   Haley Ritter S Cambreigh Dearing, CMA     

## 2019-01-04 NOTE — Assessment & Plan Note (Signed)
Long discussion with patient on risks versus benefits of PPI therapy and untreated GERD including risk of esophageal cancer. She is willing to start PPI for now until she can see Dr. Elnoria Howard. Rx Protonix sent in to pharmacy. Referral to GI (Dr. Haywood Pao office) placed. Follow-up with me in 2 weeks if not seeing specialist before then. Reviewed GERD diet and instructions sent to MyChart.

## 2019-01-04 NOTE — Progress Notes (Signed)
Virtual Visit via Video   I connected with patient on 01/04/19 at  9:00 AM EDT by a video enabled telemedicine application and verified that I am speaking with the correct person using two identifiers.  Location patient: Home Location provider: [facility] Persons participating in the virtual visit: Patient, Provider, CMA (Patina Moore)  I discussed the limitations of evaluation and management by telemedicine and the availability of in person appointments. The patient expressed understanding and agreed to proceed.  Subjective:   HPI:   Patient presents via D[employer] today for follow-up for chronic medical issues.  Chronic Rhinitis - Seasonal issues, Spring is significant. Notes runny nose with nasal congestion and PND. Denies sinus pain, ear pain or tooth pain. Notes scratchy throat. Does not take anything OTC for symptoms as he does not like to take things. Is still doing her essential oils which she feels helps somewhat.Marland Kitchen.  GERD - long-standing history. Has seen [facility] previously where EGD recommended but patient declined. Had US abdomen and x-ray esophagus revealing small narrowing of esophagus and small Zenker's diverticulum. Notes continue daily heartburn and indigestion with intermittent epigastric and LUQ discomfort. Is not taking anything currently as she does not want to take medication long term. Previously when on PPI therapy she has felt better but will not stay on one long enough to make appreciable differences.  Is interested in seeing Dr. Elnoria HowardHung (GI) for a second opinion on things as she has known him for some time. Feels if he thinks EGD is necessary then she will proceed with this. .   ROS:   See pertinent positives and negatives per HPI.  Patient Active Problem List   Diagnosis Date Noted  . Abnormal TSH 02/18/2018  . Abnormal cortisol level 02/18/2018  . Breast cancer screening 02/18/2018  . Cervical cancer screening 02/18/2018  . Postmenopausal  estrogen deficiency 02/18/2018  . Visit for preventive health examination 02/18/2018  . Allergic rhinitis due to animal hair and dander 10/12/2017  . GERD (gastroesophageal reflux disease) 09/21/2017    Social History   Tobacco Use  . Smoking status: Never Smoker  . Smokeless tobacco: Never Used  . Tobacco comment: early 20's  Substance Use Topics  . Alcohol use: No    Frequency: Never    Current Outpatient Medications:  .  magnesium 30 MG tablet, Take 30 mg by mouth 2 (two) times daily., Disp: , Rfl:  .  omega-3 fish oil (MAXEPA) 1000 MG CAPS capsule, Take 1 capsule by mouth daily., Disp: , Rfl:  .  OVER THE COUNTER MEDICATION, Digestive Ezymes - 9 daily, Disp: , Rfl:  .  Probiotic Product (PROBIOTIC DAILY PO), Take by mouth., Disp: , Rfl:  .  pantoprazole (PROTONIX) 40 MG tablet, Take 1 tablet (40 mg total) by mouth daily., Disp: 15 tablet, Rfl: 0  No Known Allergies  Objective:   BP 134/79   Pulse 64   Temp (!) 97.4 F (36.3 C) (Oral)   Ht 5\' 3"  (1.6 m)   Wt 178 lb (80.7 kg)   SpO2 99%   BMI 31.53 kg/m   Patient is well-developed, well-nourished in no acute distress.  Resting comfortably on couch at home.  Head is normocephalic, atraumatic.  No labored breathing.  Speech is clear and coherent with logical contest.  Patient is alert and oriented at baseline.   Assessment and Plan:    GERD (gastroesophageal reflux disease) Long discussion with patient on risks versus benefits of PPI therapy and untreated GERD  including risk of esophageal cancer. She is willing to start PPI for now until she can see Dr. Elnoria Howard. Rx Protonix sent in to pharmacy. Referral to GI (Dr. Haywood Pao office) placed. Follow-up with me in 2 weeks if not seeing specialist before then. Reviewed GERD diet and instructions sent to MyChart.      Piedad Climes, PA-C 01/04/2019  Time spent with the patient: 30 minutes, of which >50% was spent in obtaining information about symptoms, reviewing  previous labs, evaluations, and treatments, counseling about condition (please see the discussed topics above), and developing a plan to further investigate it; had a number of questions which I addressed.

## 2019-01-18 ENCOUNTER — Other Ambulatory Visit: Payer: Self-pay | Admitting: Physician Assistant

## 2019-01-18 DIAGNOSIS — K219 Gastro-esophageal reflux disease without esophagitis: Secondary | ICD-10-CM

## 2019-01-18 MED ORDER — PANTOPRAZOLE SODIUM 40 MG PO TBEC: 40.0000 mg | DELAYED_RELEASE_TABLET | Freq: Every day | ORAL | 0 refills | Status: DC

## 2019-01-18 NOTE — Telephone Encounter (Signed)
Refill sent. As discussed at her visit it is a PPI (Proton Pump Inhibitor) which decreases her acid production so that there is less acid to splash into the esophagus. It will let the areas heal a bit while she is taking but she needs GI assessment (Dr. Elnoria Howard) to check for potential causes of increased reflux -- hiatal hernia, etc.

## 2019-01-18 NOTE — Addendum Note (Signed)
Addended by: Con Memos on: 01/18/2019 02:38 PM   Modules accepted: Orders

## 2019-01-18 NOTE — Telephone Encounter (Signed)
Pt is asking for a refill on pantoprazole, she states that it is really helping her. Pt states that she has some questions and wiuould like a call back as well. [facility] on [address]

## 2019-01-18 NOTE — Telephone Encounter (Signed)
Left detailed message on voicemail advising the rx for Protonix was refilled The Protonix is a PPI that reduces the acid production in her stomach. Decreases the acid from splashing into her esophagus. The Protonix will allow the areas to heal. A referral has been already placed for GI with Dr Elnoria Howard for further assessment.

## 2019-01-18 NOTE — Telephone Encounter (Signed)
Patient states the Protonix is helping but she is not 100%. Patient states she wants to stay on the medication longer. Requesting a refill on medication. Will the medication help while using the natural herbs.  Patient wants to know what the drug is? Will the Protonix heal her stomach/esophagus?

## 2019-02-10 ENCOUNTER — Other Ambulatory Visit: Payer: Self-pay | Admitting: Physician Assistant

## 2019-02-10 DIAGNOSIS — K219 Gastro-esophageal reflux disease without esophagitis: Secondary | ICD-10-CM

## 2020-01-21 IMAGING — US US ABDOMEN COMPLETE
1 series · 13 of 25 positions shown · non-contrast
Comparison: None.

CLINICAL DATA: Upper abdominal pain. History of gastroesophageal
reflux.

EXAM:
ABDOMEN ULTRASOUND COMPLETE

[Series 1: us abdomen complete · 13 of 112 slices shown]
[im 1/112]
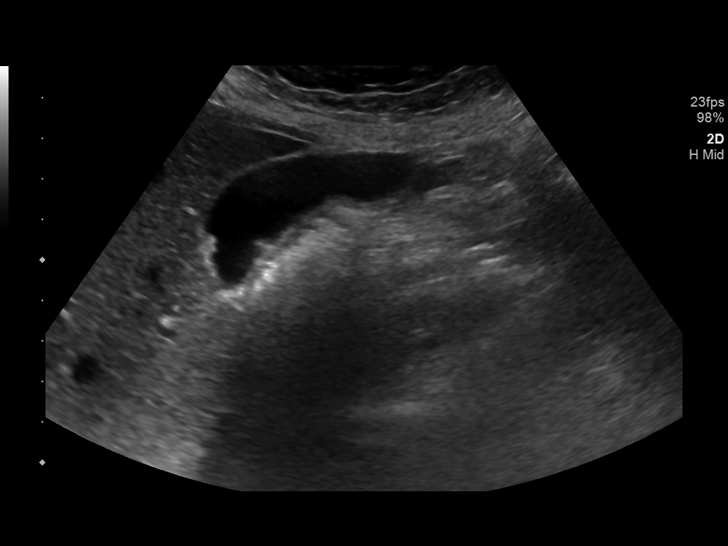
[im 10/112]
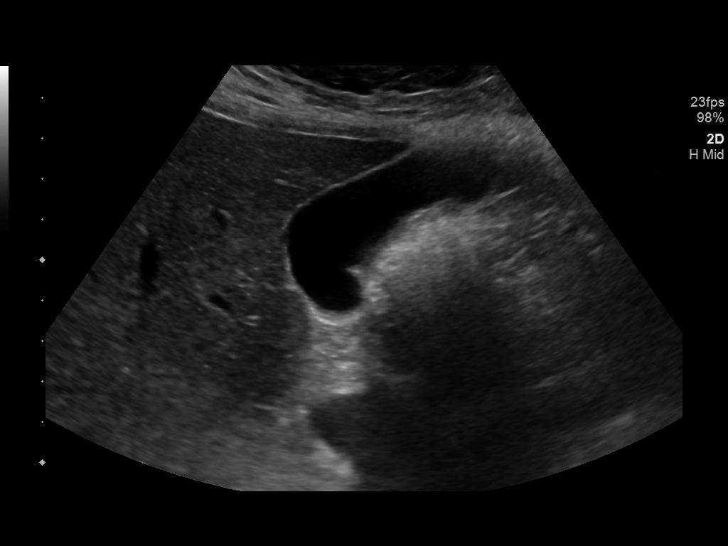
[im 19/112]
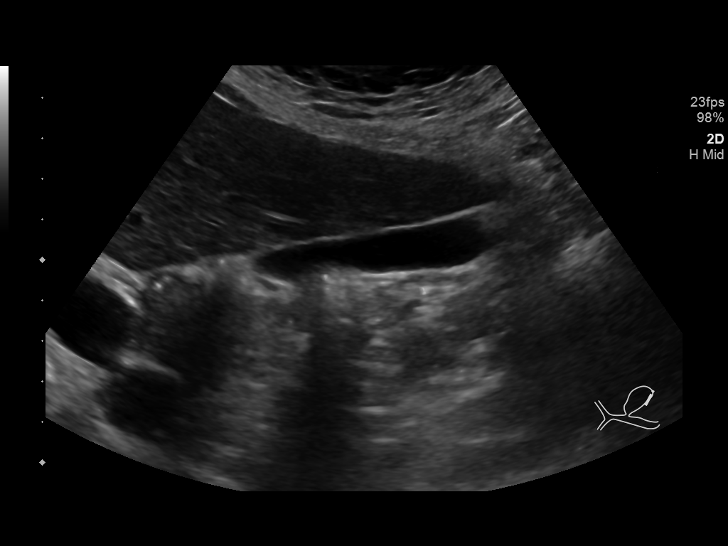
[im 28/112]
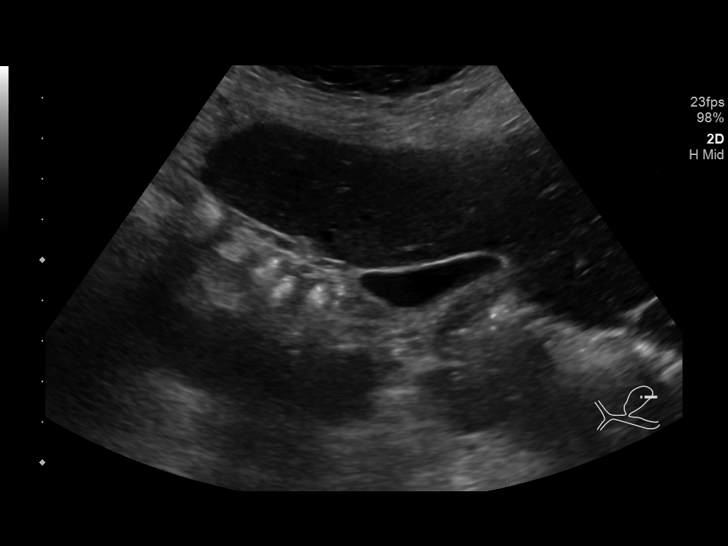
[im 38/112]
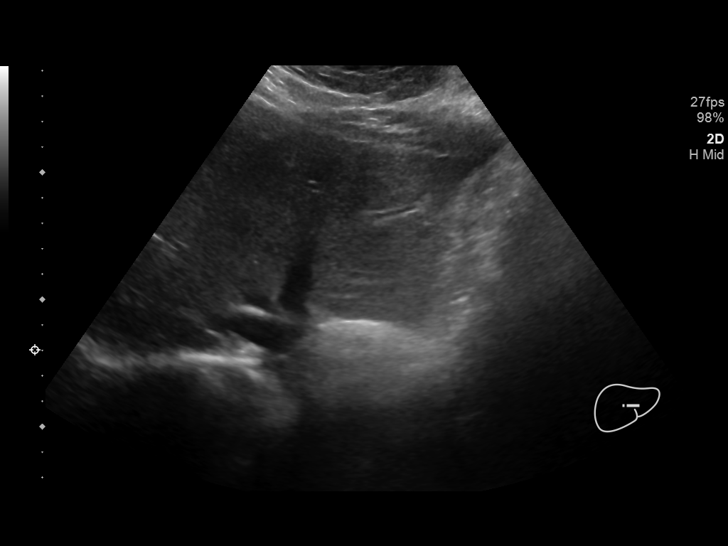
[im 47/112]
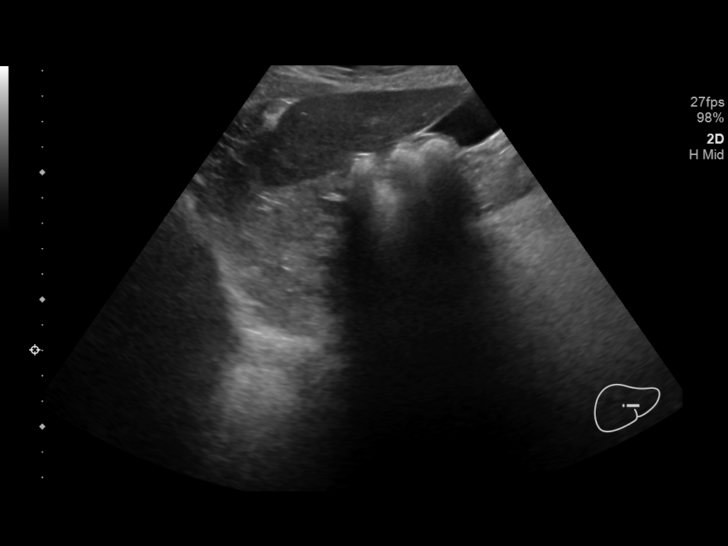
[im 56/112]
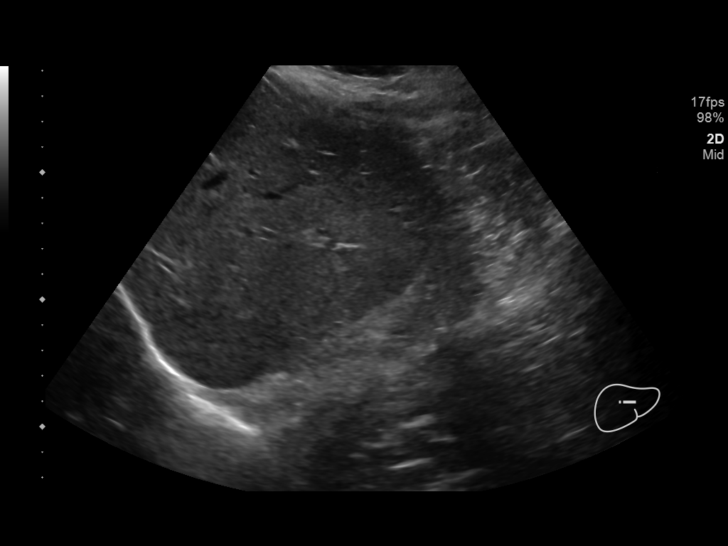
[im 65/112]
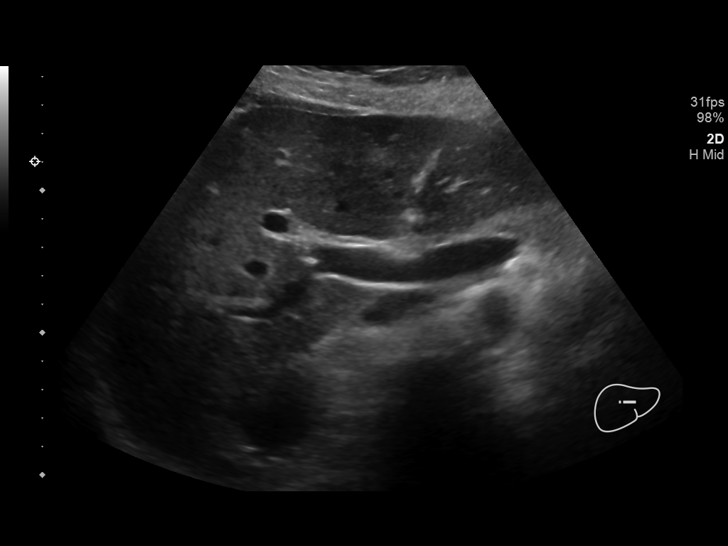
[im 75/112]
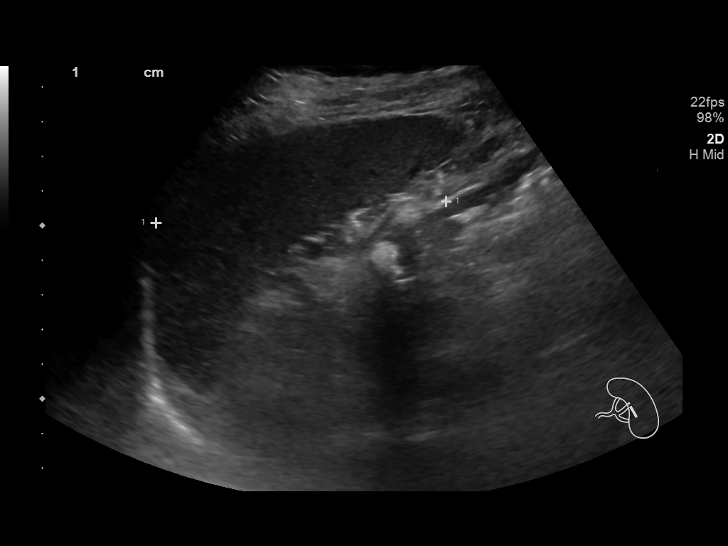
[im 84/112]
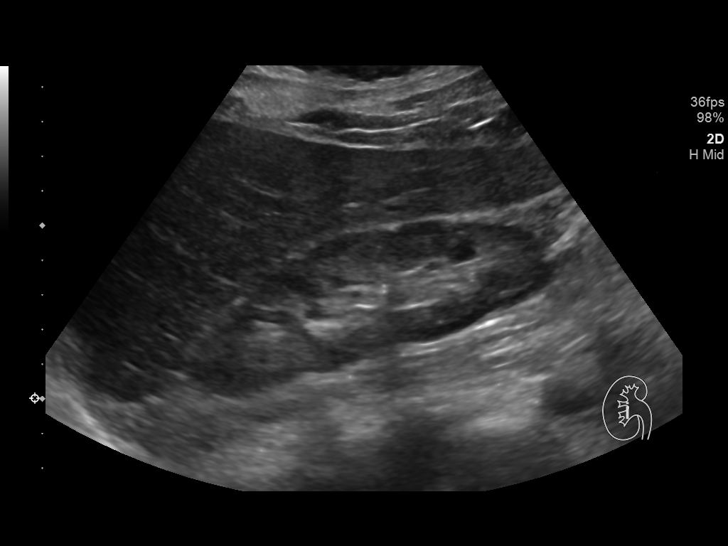
[im 93/112]
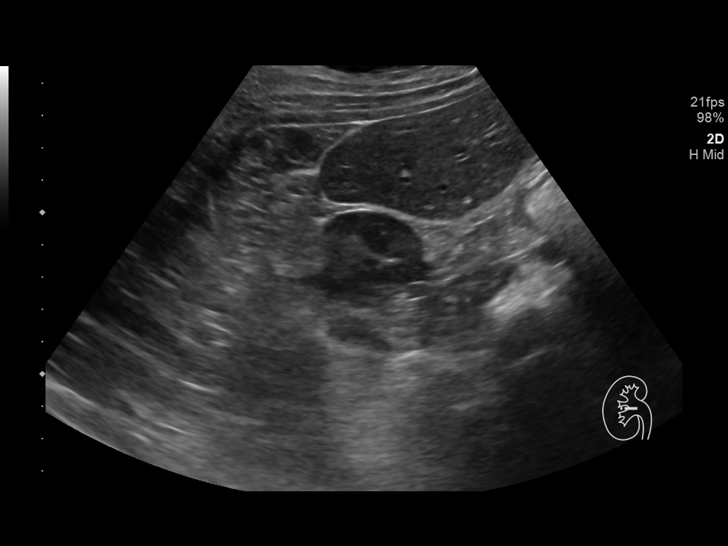
[im 102/112]
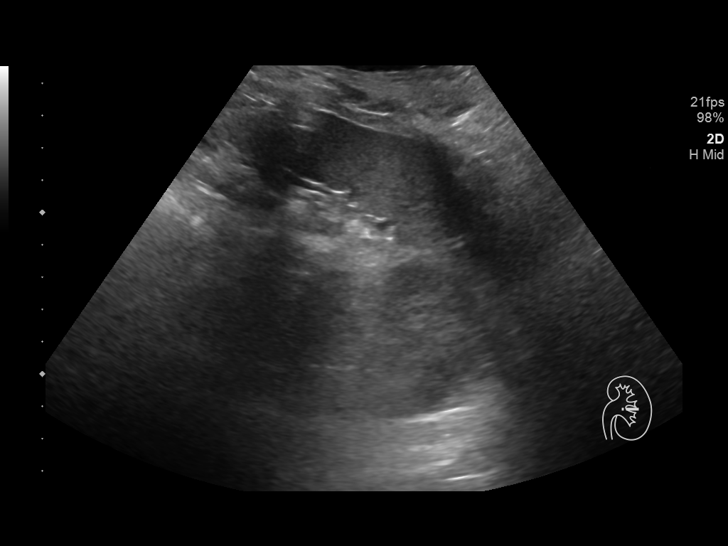
[im 112/112]
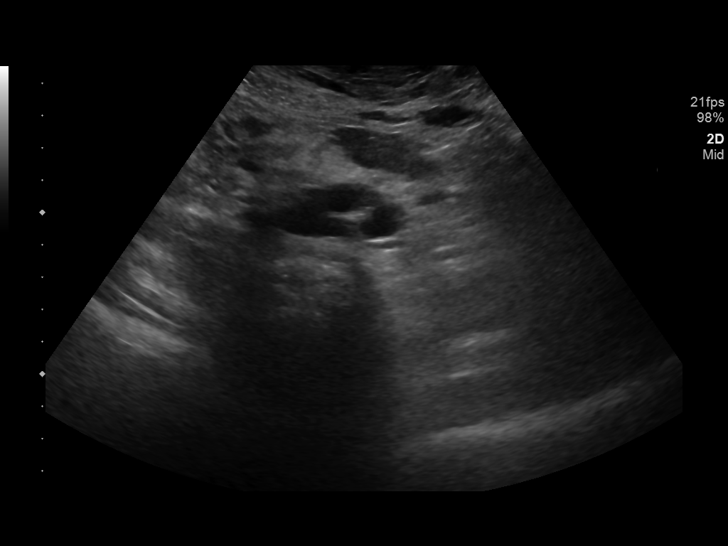

[13 of 25 positions shown; findings below may reference images not displayed]

FINDINGS: Gallbladder: The gallbladder is adequately distended. There is an
echogenic non mobile nonshadowing focus near the gallbladder neck
measuring 2.6 mm in diameter. No stones or sludge are observed.
There is no gallbladder wall thickening or positive sonographic
Murphy's sign.

Common bile duct: Diameter: 3.4 mm

Liver: The hepatic echotexture is normal. There is a cyst in the
right hepatic lobe measuring 1.3 x 1.1 x 1.1 cm. There are no solid
hepatic masses in there is no intrahepatic ductal dilation. Portal
vein is patent on color Doppler imaging with normal direction of
blood flow towards the liver.

IVC: No abnormality visualized.

Pancreas: Visualized portion unremarkable.

Spleen: Size and appearance within normal limits.

Right Kidney: Length: 11.3 cm. Echogenicity within normal limits. No
mass or hydronephrosis visualized.

Left Kidney: Length: 11.3 cm. Echogenicity within normal limits. No
mass or hydronephrosis visualized.

Abdominal aorta: No aneurysm visualized.

Other findings: There is no ascites.
IMPRESSION: Normal appearance of the gallbladder. There is a 2.6 mm polyp near
the gallbladder neck.

Simple appearing 1.3 cm cyst in the right hepatic lobe.

No acute intra-abdominal abnormality is observed.

## 2020-03-05 ENCOUNTER — Telehealth: Payer: Self-pay | Admitting: *Deleted

## 2020-03-05 ENCOUNTER — Ambulatory Visit (INDEPENDENT_AMBULATORY_CARE_PROVIDER_SITE_OTHER): Payer: BLUE CROSS/BLUE SHIELD

## 2020-03-05 ENCOUNTER — Other Ambulatory Visit: Payer: Self-pay | Admitting: Podiatry

## 2020-03-05 ENCOUNTER — Encounter: Payer: Self-pay | Admitting: Podiatry

## 2020-03-05 ENCOUNTER — Other Ambulatory Visit: Payer: Self-pay

## 2020-03-05 ENCOUNTER — Ambulatory Visit (INDEPENDENT_AMBULATORY_CARE_PROVIDER_SITE_OTHER): Payer: BLUE CROSS/BLUE SHIELD | Admitting: Podiatry

## 2020-03-05 DIAGNOSIS — M66871 Spontaneous rupture of other tendons, right ankle and foot: Secondary | ICD-10-CM

## 2020-03-05 DIAGNOSIS — M76821 Posterior tibial tendinitis, right leg: Secondary | ICD-10-CM

## 2020-03-05 DIAGNOSIS — M7750 Other enthesopathy of unspecified foot: Secondary | ICD-10-CM

## 2020-03-05 DIAGNOSIS — M67471 Ganglion, right ankle and foot: Secondary | ICD-10-CM | POA: Diagnosis not present

## 2020-03-05 NOTE — Telephone Encounter (Signed)
Orders to A. Prevette, CMA for pre-cert and faxed to [facility]. 

## 2020-03-05 NOTE — Telephone Encounter (Signed)
-----   Message from Kristian Covey, [facility] sent at 03/05/2020 12:00 PM EDT ----- Regarding: MRI MRI right ankle - evaluate posterior tibial tendon dysfunction with tendon tear and ganglion cyst anterior ankle right - surgical consideration

## 2020-03-05 NOTE — Progress Notes (Signed)
Subjective:  Patient ID: Haley Ritter, female    DOB: 1951/07/04,  MRN: [id number] HPI Chief Complaint  Patient presents with   Foot Pain    Medial ankle/foot right - aching, burning x 7 weeks, sees accupunturist regularly and foot has gotten worse, using ice, NSAIDS-not helping, swelling lateral/anterior ankle right x 30 years, no one ever treated   New Patient (Initial Visit)    69 y.o. female presents with the above complaint.   ROS: Denies fever chills nausea vomiting muscle aches pains calf pain back pain chest pain shortness of breath.  Past Medical History:  Diagnosis Date   Abnormal cortisol level 02/18/2018   Allergic rhinitis due to animal hair and dander 10/12/2017   Anxiety    Arthritis    GERD (gastroesophageal reflux disease)    Hypertension    Thyroid disease    Past Surgical History:  Procedure Laterality Date   COLONOSCOPY  10 years ago    in [place]-normal exam    HAND SURGERY Right    INDUCED ABORTION     TONSILLECTOMY      Current Outpatient Medications:    diazepam (VALIUM) 5 MG tablet, Take 5 mg by mouth at bedtime., Disp: , Rfl:    hydroxychloroquine (PLAQUENIL) 200 MG tablet, Take 200 mg by mouth 2 (two) times daily., Disp: , Rfl:    magnesium 30 MG tablet, Take 30 mg by mouth 2 (two) times daily., Disp: , Rfl:    NP THYROID 15 MG tablet, Take 15 mg by mouth every morning., Disp: , Rfl:    omega-3 fish oil (MAXEPA) 1000 MG CAPS capsule, Take 1 capsule by mouth daily., Disp: , Rfl:    OVER THE COUNTER MEDICATION, Digestive Ezymes - 9 daily, Disp: , Rfl:    pantoprazole (PROTONIX) 40 MG tablet, TAKE 1 TABLET BY MOUTH EVERY DAY, Disp: 30 tablet, Rfl: 0   Probiotic Product (PROBIOTIC DAILY PO), Take by mouth., Disp: , Rfl:    traMADol (ULTRAM) 50 MG tablet, Take 50 mg by mouth 3 (three) times daily as needed., Disp: , Rfl:   No Known Allergies Review of Systems Objective:  There were no vitals filed for this visit.  General: Well  developed, nourished, in no acute distress, alert and oriented x3   Dermatological: Skin is warm, dry and supple bilateral. Nails x 10 are well maintained; remaining integument appears unremarkable at this time. There are no open sores, no preulcerative lesions, no rash or signs of infection present.  There appears to be a ganglion cyst dorsal lateral aspect of the foot superior to the sinus tarsi.  This measures approximately 3 cm in diameter.  It is fluctuant easily movable and translucent.  Vascular: Dorsalis Pedis artery and Posterior Tibial artery pedal pulses are 2/4 bilateral with immedate capillary fill time. Pedal hair growth present. No varicosities and no lower extremity edema present bilateral.   Neruologic: Grossly intact via light touch bilateral. Vibratory intact via tuning fork bilateral. Protective threshold with Semmes Wienstein monofilament intact to all pedal sites bilateral. Patellar and Achilles deep tendon reflexes 2+ bilateral. No Babinski or clonus noted bilateral.   Musculoskeletal: No gross boney pedal deformities bilateral. No pain, crepitus, or limitation noted with foot and ankle range of motion bilateral. Muscular strength 5/5 in all groups tested bilateral.  Severe hallux abductovalgus deformity and pain on range of motion of the second metatarsophalangeal joint of the right foot with crepitation.  She has severe flatfoot deformity with tight flexible pes planus and  severe pain on palpation of the posterior tibial tendon with inability to perform interim inversion against resistance.  This is exquisitely painful on her and the area is palpable with fluctuance in the tendon sheath itself.  Gait: Unassisted, Nonantalgic.    Radiographs:  Radiographs taken today demonstrate severe osteoarthritic changes in the osseously mature individual at the level of the second metatarsophalangeal joint.  A large increase in the first intermetatarsal angle greater than normal value  consistent with hypermobile first TMT joint.  Severe hallux abductus angle increase.  Severe pes planus no fractures identified.  Ankle view demonstrates her foot out from underneath her ankle.  Severe pronation.  Soft tissue swelling in dorsal lateral aspect of the foot consistent with fluid retention or ganglion cyst.  Assessment & Plan:   Assessment: Posterior tibial tendon dysfunction probable tear of the posterior tibial tendon.  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  Severe pes planus flexible.  Plan: Discussed etiology pathology conservative versus surgical therapies at this point in time I highly recommended an MRI to evaluate for possible tear of the posterior tibial tendon.  We will also evaluate for a ganglion to the dorsal lateral aspect of the right foot.     Sincere Liuzzi T. [place], [place]

## 2020-03-06 ENCOUNTER — Telehealth: Payer: Self-pay | Admitting: Podiatry

## 2020-03-06 NOTE — Telephone Encounter (Signed)
Pt called and stated that she has her MRI scheduled for 04/08/20 and she stated that the Dr asked her to saty off foot but now she just was not sure what she needed to do wanted to know if a boot would help or what could be suggested please assist

## 2020-03-13 NOTE — Telephone Encounter (Signed)
[facility]: No Precert required if study is done in [facility]  Case#: [id number]

## 2020-04-04 ENCOUNTER — Other Ambulatory Visit: Payer: Self-pay | Admitting: Podiatry

## 2020-04-06 ENCOUNTER — Other Ambulatory Visit: Payer: BLUE CROSS/BLUE SHIELD

## 2020-04-08 ENCOUNTER — Other Ambulatory Visit: Payer: BLUE CROSS/BLUE SHIELD

## 2022-01-07 ENCOUNTER — Ambulatory Visit: Payer: Managed Care, Other (non HMO) | Admitting: Radiology

## 2022-01-07 ENCOUNTER — Encounter: Payer: Self-pay | Admitting: Radiology

## 2022-01-07 VITALS — BP 124/80 | Ht 63.0 in | Wt 175.0 lb

## 2022-01-07 DIAGNOSIS — N950 Postmenopausal bleeding: Secondary | ICD-10-CM

## 2022-01-07 NOTE — Progress Notes (Signed)
? ? ? ? ? ? ?  Irregular vaginal bleeding ?71yo G5P4 presents with c/o irregular bleeding x 1 month. Began as spotting and can occasionally be heavy like a period. Has to wear a pad daily. No LMP recorded. Patient is postmenopausal. She has been receiving estrogen and testosterone pellet therapy with Dr Margarita Grizzle for the past 4 years. She is also on oral progesterone nightly. She denies any associated pain or cramping. 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 ?Review of Systems  ?Constitutional: Negative.   ?HENT: Negative.    ?Respiratory: Negative.    ?Cardiovascular: Negative.   ?Gastrointestinal: Negative.   ?Endocrine: Negative.   ?Genitourinary:  Positive for vaginal bleeding.  ?Neurological: Negative.   ?Hematological: Negative.   ?Psychiatric/Behavioral: Negative.     ? ?Objective: ?Physical Exam ?Constitutional:   ?   Appearance: Normal appearance.  ?HENT:  ?   Head: Normocephalic and atraumatic.  ?Cardiovascular:  ?   Rate and Rhythm: Normal rate.  ?Pulmonary:  ?   Effort: Pulmonary effort is normal.  ?Abdominal:  ?   General: Abdomen is flat. Bowel sounds are normal.  ?   Palpations: Abdomen is soft.  ?Musculoskeletal:     ?   General: Normal range of motion.  ?Skin: ?   General: Skin is warm and dry.  ?Neurological:  ?   Mental Status: She is alert and oriented to person, place, and time.  ?Psychiatric:     ?   Mood and Affect: Mood normal.     ?   Thought Content: Thought content normal.     ?   Judgment: Judgment normal.  ? Pelvic exam: normal external genitalia, vulva, vagina, cervix, uterus and adnexa, VULVA: normal appearing vulva with no masses, tenderness or lesions, VAGINA: normal appearing vagina with normal color and discharge, no lesions, scant bright red blood, CERVIX: normal appearing cervix without discharge or lesions, UTERUS: uterus is normal size, shape, consistency and nontender, ADNEXA: normal adnexa in size, nontender and no  masses. ? ?Chaperone offered and declined for exam ? ?Assessment/Plan: ?1. Post-menopausal bleeding ?Schedule u/s to assess endometrial lining, discussed the possibility of a biopsy ?- US Transvaginal Non-OB; Future  ? ? ?

## 2022-02-12 ENCOUNTER — Ambulatory Visit (INDEPENDENT_AMBULATORY_CARE_PROVIDER_SITE_OTHER): Payer: Managed Care, Other (non HMO)

## 2022-02-12 ENCOUNTER — Encounter: Payer: Self-pay | Admitting: Obstetrics & Gynecology

## 2022-02-12 ENCOUNTER — Ambulatory Visit (INDEPENDENT_AMBULATORY_CARE_PROVIDER_SITE_OTHER): Payer: Managed Care, Other (non HMO) | Admitting: Obstetrics & Gynecology

## 2022-02-12 VITALS — BP 124/80

## 2022-02-12 DIAGNOSIS — Z79890 Hormone replacement therapy (postmenopausal): Secondary | ICD-10-CM

## 2022-02-12 DIAGNOSIS — N950 Postmenopausal bleeding: Secondary | ICD-10-CM | POA: Diagnosis not present

## 2022-02-12 NOTE — Progress Notes (Signed)
    Haley Ritter 02-02-1951 [id number]        71 y.o.  [id number] Married  RP: PMB on HRT for pelvic ultrasound  HPI: Postmenopausal for many years.  Seen by Integrative medicine MD who started her on HRT with Estradiol pellets and Prometrium HS.  Also getting Testosterone pellets.  Patient had her last Estradiol pellets 4 months ago and continued on Prometrium HS.  Had mild PMB last month.  No pelvic pain.  Husband has low libido, so not sexually active.   OB History  Gravida Para Term Preterm AB Living  5 4     1 4   SAB IAB Ectopic Multiple Live Births               # Outcome Date GA Lbr Len/2nd Weight Sex Delivery Anes PTL Lv  5 AB           4 Para           3 Para           2 Para           1 Para             Past medical history,surgical history, problem list, medications, allergies, family history and social history were all reviewed and documented in the EPIC chart.   Directed ROS with pertinent positives and negatives documented in the history of present illness/assessment and plan.  Exam:  Vitals:   02/12/22 1609  BP: 124/80   General appearance:  Normal  Pelvic US today: T/V images.  Small retroverted uterus normal in size and shape with 2 small intramural fibroids the largest of which is measured at 1.55 cm.  The overall uterine size is measured at 9.61 x 5.88 x 4.07 cm.  Thin and symmetrical endometrial lining measured at 2.6 mm with no obvious mass or thickening or abnormal blood flow seen.  The left ovary is small with atrophic appearance.  The right ovary shows 2 simple residual follicles measured at 1.9 and 1.4 cm.  No adnexal mass.  No free fluid in the pelvis.   Assessment/Plan:  71 y.o. [id number]   1. Post-menopausal bleeding Postmenopausal for many years.  Seen by Integrative medicine MD who started her on HRT with Estradiol pellets and Prometrium HS.  Also getting Testosterone pellets.  Patient had her last Estradiol pellets 4 months ago and continued on  Prometrium HS.  Had mild PMB last month.  No pelvic pain.  Husband has low libido, so not sexually active.  Pelvic US findings thoroughly reviewed with patient.  Patient reassured about her thin endometrial lining at 2.67 mm.  Recommend stopping hormone replacement therapy.  Has stopped estradiol pellets already and will stop Prometrium at bedtime.  Precautions reviewed about recurrence of postmenopausal bleeding.  2. Postmenopausal hormone replacement therapy Recommend to stop HRT.  May continue on Testosterone if making a positive difference.  Other orders - metFORMIN (GLUCOPHAGE) 1000 MG tablet; SMARTSIG:1 Tablet(s) By Mouth Every Evening - UNABLE TO FIND; Med Name: dimpro - UNABLE TO FIND; Med Name: NAC   Counseling and management of postmenopausal bleeding and hormone replacement therapy for 15 minutes.  Princess Bruins MD, 4:23 PM 02/12/2022

## 2022-02-22 ENCOUNTER — Encounter: Payer: Self-pay | Admitting: Obstetrics & Gynecology

## 2024-02-15 ENCOUNTER — Ambulatory Visit
Admission: RE | Admit: 2024-02-15 | Discharge: 2024-02-15 | Disposition: A | Discharge status: Home / Self Care | Source: Ambulatory Visit | Attending: Family Medicine | Admitting: Family Medicine

## 2024-02-15 ENCOUNTER — Other Ambulatory Visit: Payer: Self-pay | Admitting: Family Medicine

## 2024-02-15 DIAGNOSIS — M25551 Pain in right hip: Secondary | ICD-10-CM

## 2024-02-15 DIAGNOSIS — R0602 Shortness of breath: Secondary | ICD-10-CM

## 2024-02-15 DIAGNOSIS — M545 Low back pain, unspecified: Secondary | ICD-10-CM
